# Patient Record
Sex: Female | Born: 1995 | Hispanic: Yes | Marital: Single | Smoking: Never smoker
Health system: Southern US, Community
[De-identification: ages and names within clinical notes are randomized; demographics above are authoritative.]

## PROBLEM LIST (undated history)

## (undated) DIAGNOSIS — Z789 Other specified health status: Secondary | ICD-10-CM

## (undated) DIAGNOSIS — K2970 Gastritis, unspecified, without bleeding: Secondary | ICD-10-CM

## (undated) HISTORY — PX: NO PAST SURGERIES: SHX2092

---

## 2014-01-13 ENCOUNTER — Inpatient Hospital Stay (HOSPITAL_COMMUNITY)
Admission: AD | Admit: 2014-01-13 | Discharge: 2014-01-13 | Disposition: A | Discharge status: Home / Self Care | Payer: Self-pay | Source: Ambulatory Visit | Attending: Obstetrics and Gynecology | Admitting: Obstetrics and Gynecology

## 2014-01-13 ENCOUNTER — Encounter (HOSPITAL_COMMUNITY): Payer: Self-pay | Admitting: *Deleted

## 2014-01-13 DIAGNOSIS — B3731 Acute candidiasis of vulva and vagina: Secondary | ICD-10-CM | POA: Insufficient documentation

## 2014-01-13 DIAGNOSIS — N912 Amenorrhea, unspecified: Secondary | ICD-10-CM | POA: Insufficient documentation

## 2014-01-13 DIAGNOSIS — B373 Candidiasis of vulva and vagina: Secondary | ICD-10-CM

## 2014-01-13 DIAGNOSIS — N911 Secondary amenorrhea: Secondary | ICD-10-CM

## 2014-01-13 DIAGNOSIS — N390 Urinary tract infection, site not specified: Secondary | ICD-10-CM | POA: Insufficient documentation

## 2014-01-13 HISTORY — DX: Other specified health status: Z78.9

## 2014-01-13 LAB — WET PREP, GENITAL
Clue Cells Wet Prep HPF POC: NONE SEEN
Trich, Wet Prep: NONE SEEN
Yeast Wet Prep HPF POC: NONE SEEN

## 2014-01-13 LAB — URINE MICROSCOPIC-ADD ON

## 2014-01-13 LAB — URINALYSIS, ROUTINE W REFLEX MICROSCOPIC
Bilirubin Urine: NEGATIVE
Glucose, UA: NEGATIVE mg/dL
Ketones, ur: NEGATIVE mg/dL
Leukocytes, UA: NEGATIVE
Nitrite: NEGATIVE
Protein, ur: NEGATIVE mg/dL
Specific Gravity, Urine: 1.025 (ref 1.005–1.030)
Urobilinogen, UA: 0.2 mg/dL (ref 0.0–1.0)
pH: 5.5 (ref 5.0–8.0)

## 2014-01-13 LAB — POCT PREGNANCY, URINE: PREG TEST UR: NEGATIVE

## 2014-01-13 MED ORDER — MICONAZOLE NITRATE 2 % VA CREA: 1.0000 | TOPICAL_CREAM | Freq: Every day | VAGINAL | Status: DC

## 2014-01-13 MED ORDER — IBUPROFEN 600 MG PO TABS: 600.0000 mg | ORAL_TABLET | Freq: Four times a day (QID) | ORAL | Status: DC | PRN

## 2014-01-13 MED ORDER — CIPROFLOXACIN HCL 500 MG PO TABS: 500.0000 mg | ORAL_TABLET | Freq: Two times a day (BID) | ORAL | Status: DC

## 2014-01-13 NOTE — MAU Provider Note (Signed)
Chief Complaint: Abdominal Pain  First Provider Initiated Contact with Patient 01/13/14 2053      SUBJECTIVE HPI: Kristen Woodard is a 18 y.o. 762P1001 female, unknown pregnancy status, who presents with being late for her period, low abd pain 4/10 and vaginal discharge and itching. Hasn't taken UPT. Patient's last menstrual period was 11/16/2013. Usually has monthly cycles. Denies being under great stress, weight changes, current or recent hormonal contraception or therapy.   Past Medical History  Diagnosis Date  . Medical history non-contributory    OB History  Gravida Para Term Preterm AB SAB TAB Ectopic Multiple Living  2 1 1       1     # Outcome Date GA Lbr Len/2nd Weight Sex Delivery Anes PTL Lv  2 CUR           1 TRM 2013    M SVD EPI N Y     Past Surgical History  Procedure Laterality Date  . No past surgeries     History   Social History  . Marital Status: Single    Spouse Name: N/A    Number of Children: N/A  . Years of Education: N/A   Occupational History  . Not on file.   Social History Main Topics  . Smoking status: Never Smoker   . Smokeless tobacco: Not on file  . Alcohol Use: No  . Drug Use: No  . Sexual Activity: Yes    Birth Control/ Protection: None   Other Topics Concern  . Not on file   Social History Narrative  . No narrative on file   No current facility-administered medications on file prior to encounter.   No current outpatient prescriptions on file prior to encounter.   No Known Allergies  ROS: Pertinent positive items in HPI. Also pos for urinary frequency. Denies fever, chills, N/V/D/C, VB, vaginal odor, bleeding w/ IC, dyspareunia, hematuria, flank pain, hirsutism, unexplained weight changes, galactorrhea.   OBJECTIVE Blood pressure 102/58, pulse 85, temperature 99.1 F (37.3 C), temperature source Oral, resp. rate 18, height 5\' 5"  (1.651 m), weight 54.522 kg (120 lb 3.2 oz), last menstrual period 11/16/2013, SpO2  100.00%. GENERAL: Well-developed, well-nourished female in no acute distress.  HEENT: Normocephalic HEART: normal rate RESP: normal effort ABDOMEN: Soft, mild SP and LLQ TTP. Pos BS x 4. No CVAT.  EXTREMITIES: Nontender, no edema NEURO: Alert and oriented SPECULUM EXAM: NEFG, moderate thick, white, odorless discharge, no blood noted, cervix clean BIMANUAL: cervix closed; uterus normal size, no adnexal tenderness or masses. No CMT.  LAB RESULTS Results for orders placed during the hospital encounter of 01/13/14 (from the past 24 hour(s))  POCT PREGNANCY, URINE     Status: None   Collection Time    01/13/14  6:31 PM      Result Value Ref Range   Preg Test, Ur NEGATIVE  NEGATIVE  URINALYSIS, ROUTINE W REFLEX MICROSCOPIC     Status: Abnormal   Collection Time    01/13/14  6:35 PM      Result Value Ref Range   Color, Urine YELLOW  YELLOW   APPearance CLEAR  CLEAR   Specific Gravity, Urine 1.025  1.005 - 1.030   pH 5.5  5.0 - 8.0   Glucose, UA NEGATIVE  NEGATIVE mg/dL   Hgb urine dipstick LARGE (*) NEGATIVE   Bilirubin Urine NEGATIVE  NEGATIVE   Ketones, ur NEGATIVE  NEGATIVE mg/dL   Protein, ur NEGATIVE  NEGATIVE mg/dL   Urobilinogen, UA 0.2  0.0 - 1.0 mg/dL   Nitrite NEGATIVE  NEGATIVE   Leukocytes, UA NEGATIVE  NEGATIVE  URINE MICROSCOPIC-ADD ON     Status: Abnormal   Collection Time    01/13/14  6:35 PM      Result Value Ref Range   Squamous Epithelial / LPF FEW (*) RARE   WBC, UA 0-2  <3 WBC/hpf   RBC / HPF 3-6  <3 RBC/hpf   Bacteria, UA FEW (*) RARE   Urine-Other MUCOUS PRESENT    WET PREP, GENITAL     Status: Abnormal   Collection Time    01/13/14  9:07 PM      Result Value Ref Range   Yeast Wet Prep HPF POC NONE SEEN  NONE SEEN   Trich, Wet Prep NONE SEEN  NONE SEEN   Clue Cells Wet Prep HPF POC NONE SEEN  NONE SEEN   WBC, Wet Prep HPF POC FEW (*) NONE SEEN    IMAGING No results found.  MAU COURSE  ASSESSMENT 1. UTI (lower urinary tract infection)   2.  Secondary amenorrhea   3. Candidiasis of vulva and vagina     PLAN Discharge home     Follow-up Information   Follow up with [facility]. (As needed if no improvement by Monday 01/18/2014)    Contact information:   [address] [place] [id number] [phone]       [facility] (As needed in emergencies)    Contact information:   [address] [id number] [place] [id number] [phone]       Medication List         ACIDOPHILUS PO  Take 1 capsule by mouth daily.     ADVANCED PROBIOTIC 10 PO  Take 1 capsule by mouth daily.     ciprofloxacin 500 MG tablet  Commonly known as:  CIPRO  Take 1 tablet (500 mg total) by mouth 2 (two) times daily.     ibuprofen 600 MG tablet  Commonly known as:  ADVIL,MOTRIN  Take 1 tablet (600 mg total) by mouth every 6 (six) hours as needed for moderate pain.     miconazole 2 % vaginal cream  Commonly known as:  MONISTAT 7  Place 1 Applicatorful vaginally at bedtime.         [place] Bonnetta Allbee, CNM 01/13/2014  10:26 PM

## 2014-01-13 NOTE — Discharge Instructions (Signed)
Vulvovaginitis Candidisica (Candidal Vulvovaginitis) La vulvovaginitis candidisica es una infeccin de la vagina y la vulva. La vulva es la piel que rodea la abertura de la vagina. Puede causar picazn y Far[place] Islandsmolestias dentro de y alrededor de la vagina.  CUIDADOS EN EL HOGAR  Slo tome medicamentos como lo indique su mdico.  No mantenga relaciones sexuales hasta que la infeccin haya curado o segn le indique el mdico.  Practique sexo seguro.  Informe a su compaero sexual acerca de su infeccin.  No tome duchas vaginales ni use tampones.  Use ropa interior de algodn. No utilice pantalones ni pantimedias ajustados.  Coma yogur. Esto puede ayudar a tratar y prevenir las infecciones por cndida. SOLICITE AYUDA DE INMEDIATO SI:   Tiene fiebre.  Los problemas empeoran durante el tratamiento, o si no mejora luego de [address] das.  Tiene malestar, irritacin, o picazn en la zona de la vagina o la vulva.  Siente dolor en al Gannett Comantener relaciones sexuales.  Comienza a sentir dolor abdominal. ASEGRESE DE QUE:  Comprende estas instrucciones.  Controlar su enfermedad.  Solicitar ayuda de inmediato si no mejora o empeora. Document Released: 08/18/2010 Document Revised: 07/21/2013 [facility] Patient Information 2015 [place]ExitCare, [name]. This information is not intended to replace advice given to you by your health care provider. Make sure you discuss any questions you have with your health care provider.   Infeccin urinaria  (Urinary Tract Infection)  La infeccin urinaria puede ocurrir en Corporate treasurercualquier lugar del tracto urinario. El tracto urinario es un sistema de drenaje del cuerpo por el que se eliminan los desechos y el exceso de [place]. El tracto urinario est formado por dos riones, dos urteres, la vejiga y Engineer, miningla uretra. Los riones son rganos que tienen forma de frijol. Cada rin tiene aproximadamente el tamao del puo. Estn situados debajo de las [place]costillas, uno a cada lado de la columna  vertebral CAUSAS  La causa de la infeccin son los microbios, que son organismos microscpicos, que incluyen hongos, virus, y bacterias. Estos organismos son tan pequeos que slo pueden verse a travs del microscopio. Las bacterias son los microorganismos que ms comnmente causan infecciones urinarias.  SNTOMAS  Los sntomas pueden variar segn la edad y el sexo del paciente y por la ubicacin de la infeccin. Los sntomas en las mujeres jvenes incluyen la necesidad frecuente e intensa de orinar y una sensacin dolorosa de ardor en la vejiga o en la uretra durante la miccin. Las mujeres y los hombres mayores podrn sentir cansancio, temblores y debilidad y Futures tradersentir dolores musculares y Engineer, miningdolor abdominal. Si tiene Dunlapfiebre, puede significar que la infeccin est en los riones. Otros sntomas son dolor en la espalda o en los lados debajo de las [place]illas, nuseas y vmitos.  DIAGNSTICO  Para diagnosticar una infeccin urinaria, el mdico le preguntar acerca de sus sntomas. Genuine Partsambin le solicitar una Rockdalemuestra de Comor[place]orina. La muestra de orina se analiza para Engineer, manufacturingdetectar bacterias y glbulos blancos de Risk managerla sangre. Los glbulos blancos se forman en el organismo para ayudar a Artistcombatir las infecciones.  TRATAMIENTO  Por lo general, las infecciones urinarias pueden tratarse con medicamentos. Debido a que la Harley-Davidsonmayora de las infecciones son causadas por bacterias, por lo general pueden tratarse con antibiticos. La eleccin del antibitico y la duracin del tratamiento depender de sus sntomas y el tipo de bacteria causante de la infeccin.  INSTRUCCIONES PARA EL CUIDADO EN EL HOGAR   Si le recetaron antibiticos, tmelos exactamente como su mdico le indique. Termine el medicamento aunque se  sienta mejor despus de haber tomado slo algunos.  Beba gran cantidad de lquido para mantener la orina de tono claro o color amarillo plido.  Evite la cafena, el t y las [address] Hospital Placebebidas gaseosas. Estas sustancias irritan la  vejiga.  Vaciar la vejiga con frecuencia. Evite retener la orina durante largos perodos.  Vace la vejiga antes y despus de Management consultanttener relaciones sexuales.  Despus de mover el intestino, las mujeres deben higienizarse la regin perineal desde adelante hacia atrs. Use slo un papel tissue por vez. SOLICITE ATENCIN MDICA SI:   Siente dolor en la espalda.  Le sube la fiebre.  Los sntomas no mejoran luego de [address] das. SOLICITE ATENCIN MDICA DE INMEDIATO SI:   Siente dolor intenso en la espalda o en la zona inferior del abdomen.  Comienza a sentir escalofros.  Tiene nuseas o vmitos.  Tiene una sensacin continua de quemazn o molestias al ConocoPhillipsorinar. ASEGRESE DE QUE:   Comprende estas instrucciones.  Controlar su enfermedad.  Solicitar ayuda de inmediato si no mejora o empeora. Document Released: 04/25/2005 Document Revised: 04/09/2012 [facility]Exit[facility] Patient Information 2015 [place]ExitCare, [name]. This information is not intended to replace advice given to you by your health care provider. Make sure you discuss any questions you have with your health care provider.

## 2014-01-13 NOTE — MAU Note (Signed)
Has been having abd pain for the last wk and a half.  Doesn't know if she is preg.

## 2014-01-15 LAB — URINE CULTURE: Special Requests: NORMAL

## 2014-01-16 LAB — GC/CHLAMYDIA PROBE AMP
CT Probe RNA: NEGATIVE
GC Probe RNA: NEGATIVE

## 2014-01-18 NOTE — MAU Provider Note (Signed)
Attestation of Attending Supervision of Advanced Practitioner: Evaluation and management procedures were performed by the PA/NP/CNM/OB Fellow under my supervision/collaboration. Chart reviewed and agree with management and plan.  FERGUSON,JOHN V 01/18/2014 7:59 PM

## 2014-01-27 ENCOUNTER — Inpatient Hospital Stay (HOSPITAL_COMMUNITY): Payer: Self-pay

## 2014-01-27 ENCOUNTER — Inpatient Hospital Stay (HOSPITAL_COMMUNITY)
Admission: AD | Admit: 2014-01-27 | Discharge: 2014-01-27 | Disposition: A | Discharge status: Home / Self Care | Payer: Self-pay | Source: Ambulatory Visit | Attending: Family Medicine | Admitting: Family Medicine

## 2014-01-27 ENCOUNTER — Encounter (HOSPITAL_COMMUNITY): Payer: Self-pay | Admitting: Medical

## 2014-01-27 DIAGNOSIS — O9989 Other specified diseases and conditions complicating pregnancy, childbirth and the puerperium: Secondary | ICD-10-CM

## 2014-01-27 DIAGNOSIS — R109 Unspecified abdominal pain: Secondary | ICD-10-CM | POA: Insufficient documentation

## 2014-01-27 DIAGNOSIS — N644 Mastodynia: Secondary | ICD-10-CM | POA: Insufficient documentation

## 2014-01-27 DIAGNOSIS — O99891 Other specified diseases and conditions complicating pregnancy: Secondary | ICD-10-CM | POA: Insufficient documentation

## 2014-01-27 DIAGNOSIS — O26899 Other specified pregnancy related conditions, unspecified trimester: Secondary | ICD-10-CM

## 2014-01-27 LAB — CBC WITH DIFFERENTIAL/PLATELET
BASOS PCT: 0 % (ref 0–1)
Basophils Absolute: 0 10*3/uL (ref 0.0–0.1)
Eosinophils Absolute: 0.5 10*3/uL (ref 0.0–0.7)
Eosinophils Relative: 6 % — ABNORMAL HIGH (ref 0–5)
HEMATOCRIT: 39.1 % (ref 36.0–46.0)
HEMOGLOBIN: 13.7 g/dL (ref 12.0–15.0)
Lymphocytes Relative: 35 % (ref 12–46)
Lymphs Abs: 2.9 10*3/uL (ref 0.7–4.0)
MCH: 32.8 pg (ref 26.0–34.0)
MCHC: 35 g/dL (ref 30.0–36.0)
MCV: 93.5 fL (ref 78.0–100.0)
MONO ABS: 0.8 10*3/uL (ref 0.1–1.0)
MONOS PCT: 9 % (ref 3–12)
Neutro Abs: 4.3 10*3/uL (ref 1.7–7.7)
Neutrophils Relative %: 50 % (ref 43–77)
Platelets: 232 10*3/uL (ref 150–400)
RBC: 4.18 MIL/uL (ref 3.87–5.11)
RDW: 12.6 % (ref 11.5–15.5)
WBC: 8.4 10*3/uL (ref 4.0–10.5)

## 2014-01-27 LAB — URINALYSIS, ROUTINE W REFLEX MICROSCOPIC
BILIRUBIN URINE: NEGATIVE
GLUCOSE, UA: NEGATIVE mg/dL
Ketones, ur: NEGATIVE mg/dL
Leukocytes, UA: NEGATIVE
Nitrite: NEGATIVE
PROTEIN: NEGATIVE mg/dL
Specific Gravity, Urine: 1.01 (ref 1.005–1.030)
Urobilinogen, UA: 0.2 mg/dL (ref 0.0–1.0)
pH: 6 (ref 5.0–8.0)

## 2014-01-27 LAB — POCT PREGNANCY, URINE: PREG TEST UR: POSITIVE — AB

## 2014-01-27 LAB — URINE MICROSCOPIC-ADD ON

## 2014-01-27 LAB — HCG, QUANTITATIVE, PREGNANCY: hCG, Beta Chain, Quant, S: 386 m[IU]/mL — ABNORMAL HIGH (ref <5)

## 2014-01-27 NOTE — MAU Note (Signed)
Painful intercourse x 3 days; pain in vaginal walls during intercourse. Denies vaginal irritation or itching, vaginal pain when not having intercourse. Lower abdominal cramping x 4 days. Denies vaginal bleeding. Vaginal discharge is white & thick. Burning with urination x 1 week. LMP 5/24. No birth control. Left breast pain & nipple discharge x 2 weeks; discharge is yellow & watery. Denies fevers.

## 2014-01-27 NOTE — MAU Provider Note (Signed)
History     CSN: [id number]  Arrival date and time: 01/27/14 2103   First Provider Initiated Contact with Patient 01/27/14 2218      Chief Complaint  Patient presents with  . Breast Pain  . Abdominal Pain   HPI [name] is a 18 y.o. G2P1001 at 5392w3d by LMP who presents to MAU today with complaint of lower abdominal cramping and vaginal discharge. The patient states LMP of 12/20/13. She is unsure if she is pregnant. She endorses a thick, white discharge currently with mild itching, but is currently taking Rx for Miconazole for yeast. She feels that this is helping. She denies vaginal bleeding, N//V or fever. She does endorse occasional dysuria but just recently completed a course of Cipro.   OB History   Grav Para Term Preterm Abortions TAB SAB Ect Mult Living   2 1 1       1       Past Medical History  Diagnosis Date  . Medical history non-contributory     Past Surgical History  Procedure Laterality Date  . No past surgeries      History reviewed. No pertinent family history.  History  Substance Use Topics  . Smoking status: Never Smoker   . Smokeless tobacco: Not on file  . Alcohol Use: No    Allergies: No Known Allergies  Prescriptions prior to admission  Medication Sig Dispense Refill  . ciprofloxacin (CIPRO) 500 MG tablet Take 1 tablet (500 mg total) by mouth 2 (two) times daily.  6 tablet  0  . ibuprofen (ADVIL,MOTRIN) 600 MG tablet Take 1 tablet (600 mg total) by mouth every 6 (six) hours as needed for moderate pain.  30 tablet  1  . miconazole (MONISTAT 7) 2 % vaginal cream Place 1 Applicatorful vaginally at bedtime.  45 g  0    Review of Systems  Constitutional: Negative for fever and malaise/fatigue.  Gastrointestinal: Positive for nausea, vomiting and abdominal pain. Negative for diarrhea and constipation.  Genitourinary: Positive for dysuria. Negative for urgency, frequency and hematuria.       Neg -vaginal bleeding + vaginal discharge    Physical Exam   Blood pressure 95/50, pulse 64, temperature 98.9 F (37.2 C), temperature source Oral, resp. rate 16, height 5\' 5"  (1.651 m), weight 123 lb (55.792 kg), last menstrual period 12/20/2013, not currently breastfeeding.  Physical Exam  Constitutional: She is oriented to person, place, and time. She appears well-developed and well-nourished. No distress.  HENT:  Head: Normocephalic and atraumatic.  Cardiovascular: Normal rate.   Respiratory: Effort normal.  GI: Soft. Bowel sounds are normal. She exhibits no distension and no mass. There is tenderness (mild tenderness to palpation of the lower abdomen at midline). There is no rebound and no guarding.  Neurological: She is alert and oriented to person, place, and time.  Skin: Skin is warm and dry. No erythema.  Psychiatric: She has a normal mood and affect.   Pelvic deferred - patient is still using Miconazole today  Results for orders placed during the hospital encounter of 01/27/14 (from the past 24 hour(s))  URINALYSIS, ROUTINE W REFLEX MICROSCOPIC     Status: Abnormal   Collection Time    01/27/14  9:15 PM      Result Value Ref Range   Color, Urine YELLOW  YELLOW   APPearance CLEAR  CLEAR   Specific Gravity, Urine 1.010  1.005 - 1.030   pH 6.0  5.0 - 8.0   Glucose, UA  NEGATIVE  NEGATIVE mg/dL   Hgb urine dipstick TRACE (*) NEGATIVE   Bilirubin Urine NEGATIVE  NEGATIVE   Ketones, ur NEGATIVE  NEGATIVE mg/dL   Protein, ur NEGATIVE  NEGATIVE mg/dL   Urobilinogen, UA 0.2  0.0 - 1.0 mg/dL   Nitrite NEGATIVE  NEGATIVE   Leukocytes, UA NEGATIVE  NEGATIVE  URINE MICROSCOPIC-ADD ON     Status: None   Collection Time    01/27/14  9:15 PM      Result Value Ref Range   Squamous Epithelial / LPF RARE  RARE   WBC, UA 0-2  <3 WBC/hpf   RBC / HPF 0-2  <3 RBC/hpf   Bacteria, UA RARE  RARE  POCT PREGNANCY, URINE     Status: Abnormal   Collection Time    01/27/14  9:22 PM      Result Value Ref Range   Preg Test, Ur  POSITIVE (*) NEGATIVE  CBC WITH DIFFERENTIAL     Status: Abnormal   Collection Time    01/27/14 10:28 PM      Result Value Ref Range   WBC 8.4  4.0 - 10.5 K/uL   RBC 4.18  3.87 - 5.11 MIL/uL   Hemoglobin 13.7  12.0 - 15.0 g/dL   HCT 81.139.1  91.436.0 - 78.246.0 %   MCV 93.5  78.0 - 100.0 fL   MCH 32.8  26.0 - 34.0 pg   MCHC 35.0  30.0 - 36.0 g/dL   RDW 95.612.6  21.311.5 - 08.615.5 %   Platelets 232  150 - 400 K/uL   Neutrophils Relative % 50  43 - 77 %   Neutro Abs 4.3  1.7 - 7.7 K/uL   Lymphocytes Relative 35  12 - 46 %   Lymphs Abs 2.9  0.7 - 4.0 K/uL   Monocytes Relative 9  3 - 12 %   Monocytes Absolute 0.8  0.1 - 1.0 K/uL   Eosinophils Relative 6 (*) 0 - 5 %   Eosinophils Absolute 0.5  0.0 - 0.7 K/uL   Basophils Relative 0  0 - 1 %   Basophils Absolute 0.0  0.0 - 0.1 K/uL  ABO/RH     Status: None   Collection Time    01/27/14 10:28 PM      Result Value Ref Range   ABO/RH(D) O POS    HCG, QUANTITATIVE, PREGNANCY     Status: Abnormal   Collection Time    01/27/14 10:28 PM      Result Value Ref Range   hCG, Beta Chain, Quant, S 386 (*) <5 mIU/mL   [place] Ob Comp Less 14 Wks  01/27/2014   CLINICAL DATA:  Pain. Clinical gestational age of [redacted] weeks and 3 days.  EXAM: OBSTETRIC <14 WK US AND TRANSVAGINAL OB US  TECHNIQUE: Both transabdominal and transvaginal ultrasound examinations were performed for complete evaluation of the gestation as well as the maternal uterus, adnexal regions, and pelvic cul-de-sac. Transvaginal technique was performed to assess early pregnancy.  COMPARISON:  None.  FINDINGS: Intrauterine gestational sac: None  Yolk sac:  No  Embryo:  No  Cardiac Activity: No  Heart Rate:  Not applicable bpm  Maternal uterus/adnexae:  Subchorionic hemorrhage: None  Right ovary: Normal  Left ovary: Normal  Other :  Free fluid:  Small amount of free fluid is noted within the pelvis.  IMPRESSION: 1. No intrauterine gestational sac, yolk sac, or fetal pole identified. Differential considerations include  intrauterine pregnancy too early to be  sonographically visualized, missed abortion, or ectopic pregnancy. Followup ultrasound is recommended in 10-14 days for further evaluation.   Electronically Signed   By: Signa Kell M.D.   On: 01/27/2014 23:12    MAU Course  Procedures None  MDM Interpreter present for visit CBC, quant hCG, ABO/Rh and [place] today   Assessment and Plan  A: Abdominal pain in pregnancy, antepartum Pregnancy of unknown location  P: Discharge home Patient advised to continue miconazole until completed for yeast vulvovaginitis previously diagnosed Patient given instructions for Unisom/B6 included on AVS Patient given contact information for [facility] to call to start prenatal care Patient to return to MAU in 48 hours for follow-up quant hCG Bleeding/ectopic precautions discussed Patient may return to MAU as needed or if her condition were to change or worsen   Freddi Starr, PA-C  01/27/2014, 10:18 PM

## 2014-01-27 NOTE — Discharge Instructions (Signed)
Kristen Woodard, Engineer, maintenancerimer trimestre (Pregnancy, First Trimester) El primer trimestre son los primeros tres meses en los que el beb crece dentro suyo. Es importante seguir las indicaciones del profesional que lo asiste. CUIDADOS EN EL HOGAR  No fumar.  No beba alcohol.  Tomar slo los medicamentos que el mdico le haya indicado.  Ejercicios.  Consuma una dieta saludable. Consuma alimentos balanceados.  Puede tener relaciones sexuales si no hay problemas con el Kristen Woodard.  Ayuda para los nuseas matinales:  Coma galletitas saladas antes de levantarse por la [place] Citymaana.  Coma 4  5 comidas pequeas por da en vez de tres grandes.  Beba lquidos entre comidas, no durante ellas.  Concurra a la Training and development officerconsulta con el profesional que lo asiste de acuerdo a lo que le haya indicado.  A veces necesita vitaminas extra y hierro Academic librariandurante el Kristen Woodard. Pregunte al mdico si las necesita. SOLICITE AYUDA DE INMEDIATO SI:  La temperatura oral.  Percibe que tiene una prdida con olor ftido que proviene de la vagina.  Observa una hemorragia que proviene de la vagina.  Siente dolor intenso en el vientre o la espalda.  Vomita sangre. Vmitos que parecen borra de caf.  Pierde ms de 1 Kg en una semana.  Gana ms de 2 Kg en una semana.  Aumenta ms de 1 Kg en una semana Y nota los tobillos, los pies o las piernas hinchados.  Siente mareos o se desmaya.  Ha estado cerca de personas con rubola. , varicela, o la quinta enfermedad.  Presenta dolor de cabeza, diarrea, dolor al orinar o le falta la respiracin. Document Released: 10/12/2008 Document Revised: 10/08/2011 [facility] Division[facility] Patient Information 2015 [place]ExitCare, [name]. This information is not intended to replace advice given to you by your health care provider. Make sure you discuss any questions you have with your health care provider.  For the nausea you can take an over the counter medication known as UNISOM in combination with VITAMIN B6 50mg  twice a day.  This is the same as a prescription medication known as Diclegis, and is the only known safe medication to take in early pregnancy. There are other medications available if this does now work. However, those medications are not known to be as safe.

## 2014-01-28 LAB — ABO/RH: ABO/RH(D): O POS

## 2014-01-28 NOTE — MAU Provider Note (Signed)
Attestation of Attending Supervision of Advanced Practitioner (PA/CNM/NP): Evaluation and management procedures were performed by the Advanced Practitioner under my supervision and collaboration.  I have reviewed the Advanced Practitioner's note and chart, and I agree with the management and plan.  Reva BoresPRATT,Mayci Haning S, MD [facility] Attending 01/28/2014 12:56 AM

## 2014-01-29 ENCOUNTER — Inpatient Hospital Stay (HOSPITAL_COMMUNITY)
Admission: AD | Admit: 2014-01-29 | Discharge: 2014-01-29 | Disposition: A | Discharge status: Home / Self Care | Payer: Self-pay | Source: Ambulatory Visit | Attending: Obstetrics and Gynecology | Admitting: Obstetrics and Gynecology

## 2014-01-29 DIAGNOSIS — O9989 Other specified diseases and conditions complicating pregnancy, childbirth and the puerperium: Principal | ICD-10-CM

## 2014-01-29 DIAGNOSIS — N898 Other specified noninflammatory disorders of vagina: Secondary | ICD-10-CM

## 2014-01-29 DIAGNOSIS — O3680X0 Pregnancy with inconclusive fetal viability, not applicable or unspecified: Secondary | ICD-10-CM

## 2014-01-29 DIAGNOSIS — O99891 Other specified diseases and conditions complicating pregnancy: Secondary | ICD-10-CM

## 2014-01-29 DIAGNOSIS — O26899 Other specified pregnancy related conditions, unspecified trimester: Secondary | ICD-10-CM

## 2014-01-29 DIAGNOSIS — R109 Unspecified abdominal pain: Secondary | ICD-10-CM

## 2014-01-29 LAB — URINALYSIS, ROUTINE W REFLEX MICROSCOPIC
BILIRUBIN URINE: NEGATIVE
Glucose, UA: NEGATIVE mg/dL
Ketones, ur: NEGATIVE mg/dL
Leukocytes, UA: NEGATIVE
NITRITE: NEGATIVE
PROTEIN: NEGATIVE mg/dL
Specific Gravity, Urine: 1.02 (ref 1.005–1.030)
UROBILINOGEN UA: 0.2 mg/dL (ref 0.0–1.0)
pH: 5.5 (ref 5.0–8.0)

## 2014-01-29 LAB — URINE MICROSCOPIC-ADD ON

## 2014-01-29 LAB — HCG, QUANTITATIVE, PREGNANCY: HCG, BETA CHAIN, QUANT, S: 1041 m[IU]/mL — AB (ref <5)

## 2014-01-29 NOTE — MAU Provider Note (Signed)
Chief Complaint: No chief complaint on file.  Contact at [address] Interpreter used   SUBJECTIVE HPI: Kristen Woodard is a 18 y.o. G2P1001 at 9910w5d by LMP who presents for F/U bHCG. Denies abdominal pain now; no bleeding. Presented 2 days ago here with lower abdominal pain and vaginal discharge. Quantitative beta hCG 386. [facility]inal[facility], WP and cultures negative. Hemoglobin 13.7; blood type O+.  Past Medical History  Diagnosis Date  . Medical history non-contributory    OB History  Gravida Para Term Preterm AB SAB TAB Ectopic Multiple Living  2 1 1       1     # Outcome Date GA Lbr Len/2nd Weight Sex Delivery Anes PTL Lv  2 CUR           1 TRM 2013    M SVD EPI N Y     Past Surgical History  Procedure Laterality Date  . No past surgeries     History   Social History  . Marital Status: Single    Spouse Name: N/A    Number of Children: N/A  . Years of Education: N/A   Occupational History  . Not on file.   Social History Main Topics  . Smoking status: Never Smoker   . Smokeless tobacco: Not on file  . Alcohol Use: No  . Drug Use: No  . Sexual Activity: Yes    Birth Control/ Protection: None   Other Topics Concern  . Not on file   Social History Narrative  . No narrative on file   No current facility-administered medications on file prior to encounter.   Current Outpatient Prescriptions on File Prior to Encounter  Medication Sig Dispense Refill  . miconazole (MONISTAT 7) 2 % vaginal cream Place 1 Applicatorful vaginally at bedtime.  45 g  0   No Known Allergies  ROS: Pertinent items in HPI  OBJECTIVE Last menstrual period 12/20/2013. GENERAL: Well-developed, well-nourished female in no acute distress.  HEENT: Normocephalic HEART: normal rate RESP: normal effort ABDOMEN: Soft, non-tender EXTREMITIES: Nontender, no edema NEURO: Alert and oriented  LAB RESULTS Results for orders placed during the hospital encounter  of 01/29/14 (from the past 24 hour(s))  URINALYSIS, ROUTINE W REFLEX MICROSCOPIC     Status: Abnormal   Collection Time    01/29/14 10:05 PM      Result Value Ref Range   Color, Urine YELLOW  YELLOW   APPearance CLEAR  CLEAR   Specific Gravity, Urine 1.020  1.005 - 1.030   pH 5.5  5.0 - 8.0   Glucose, UA NEGATIVE  NEGATIVE mg/dL   Hgb urine dipstick SMALL (*) NEGATIVE   Bilirubin Urine NEGATIVE  NEGATIVE   Ketones, ur NEGATIVE  NEGATIVE mg/dL   Protein, ur NEGATIVE  NEGATIVE mg/dL   Urobilinogen, UA 0.2  0.0 - 1.0 mg/dL   Nitrite NEGATIVE  NEGATIVE   Leukocytes, UA NEGATIVE  NEGATIVE  URINE MICROSCOPIC-ADD ON     Status: Abnormal   Collection Time    01/29/14 10:05 PM      Result Value Ref Range   Squamous Epithelial / LPF FEW (*) RARE   WBC, UA 0-2  <3 WBC/hpf   RBC / HPF 3-6  <3 RBC/hpf   Bacteria, UA FEW (*) RARE  HCG, QUANTITATIVE, PREGNANCY     Status: Abnormal   Collection Time    01/29/14 10:07 PM      Result Value Ref Range   hCG,  Beta Chain, Quant, S 1041 (*) <5 mIU/mL    IMAGING [place] Ob Comp Less 14 Wks  01/27/2014   CLINICAL DATA:  Pain. Clinical gestational age of [redacted] weeks and 3 days.  EXAM: OBSTETRIC <14 WK US AND TRANSVAGINAL OB US  TECHNIQUE: Both transabdominal and transvaginal ultrasound examinations were performed for complete evaluation of the gestation as well as the maternal uterus, adnexal regions, and pelvic cul-de-sac. Transvaginal technique was performed to assess early pregnancy.  COMPARISON:  None.  FINDINGS: Intrauterine gestational sac: None  Yolk sac:  No  Embryo:  No  Cardiac Activity: No  Heart Rate:  Not applicable bpm  Maternal uterus/adnexae:  Subchorionic hemorrhage: None  Right ovary: Normal  Left ovary: Normal  Other :  Free fluid:  Small amount of free fluid is noted within the pelvis.  IMPRESSION: 1. No intrauterine gestational sac, yolk sac, or fetal pole identified. Differential considerations include intrauterine pregnancy too early to be  sonographically visualized, missed abortion, or ectopic pregnancy. Followup ultrasound is recommended in 10-14 days for further evaluation.   Electronically Signed   By: Signa Kellaylor  Stroud M.D.   On: 01/27/2014 23:12    MAU COURSE  ASSESSMENT 1. Pregnancy, location unknown   2. Abdominal pain affecting pregnancy   G2P1001 4828w2d, appropriate rise in quant bHCG  PLAN Discharge home with ectopic precautions Viability US 02/08/14   Medication List         miconazole 2 % vaginal cream  Commonly known as:  MONISTAT 7  Place 1 Applicatorful vaginally at bedtime.       Follow-up Information   Follow up In 1 week. ([facility] [place] [facility] you with appt.)       Danae OrleansDeirdre C Chaz Mcglasson, CNM 01/29/2014  9:52 PM

## 2014-01-29 NOTE — Discharge Instructions (Signed)
Embarazo ectpico ( Ectopic Pregnancy) Un embarazo ectpico ocurre cuando el vulo fertilizado se fija (implanta) fuera del tero. La mayora de los embarazos ectpicos se producen en la trompa de WilliamsFalopio. Es raro que ocurran en el ovario, el intestino, la pelvis o el cuello uterino. En un embarazo ectpico, el vulo fertilizado no tiene la capacidad de llegar a ser un beb normal y sano.  Kristen DearUna ruptura en el embarazo ectpico se produce cuando la trompa de Falopio se desgarra o se rompe y Futures traderda como resultado una hemorragia interna. A menudo hay un intenso dolor abdominal y en algunos casos sangrado vaginal. Tener un embarazo ectpico puede ser Kristen Dearuna experiencia que pone en peligro la vida. Si no se trata, esta grave afeccin puede requerir una transfusin de [name], ciruga abdominal, o incluso causar la muerte. CAUSAS  En la Harley-Davidsonmayora de los casos se sospecha un dao en las trompas de Falopio como causa del Multimedia programmerembarazo ectpico.  FACTORES DE RIESGO Dependiendo de sus circunstancias, el nivel de riesgo de tener un embarazo ectpico variar. El [name] de riesgo puede dividirse en tres categoras. Alto Riesgo  Usted ha realizado tratamientos para la infertilidad.  Ha tenido un embarazo ectpico previo.  Fue sometida a [place] Herz[place]inauna ciruga de trompas.  Tuvo una ciruga previa para ligar las trompas de Falopio (ligadura de trompas).  Tiene problemas o enfermedades en las trompas.  Ha estado expuesta al DES. El DES es un medicamento que se Nechama Guardutiliz hasta 1971 y Cox Communicationstuvo efectos en los bebs cuyas madres lo tomaron.  Queda embarazada mientras Botswanausa un dispositivo intrauterino (DIU) como mtodo anticonceptivo. Riesgo moderado  Tiene antecedentes de infertilidad.  Ha sufrido alguna enfermedad de transmisin sexual (ETS).  Tiene antecedentes de enfermedad plvica inflamatoria (EPI).  Tiene cicatrices por endometriosis.  Tiene mltiples parejas sexuales.  Fuma. Bajo riesgo  Fue sometida a una ciruga  plvica.  [name] duchas vaginales.  Comenz a ser [name] activa antes de los 18 aos de [name]. SIGNOS Y SNTOMAS  El embarazo ectpico se debe sospechar en cualquier mujer a la que le ha faltado un perodo y tiene dolor abdominal o sangrado.  Puede experimentar sntomas normales de [name], tales como:  Nuseas.  Cansancio.  Inflamacin mamaria.  Otros sntomas son:  Futures traderDolor durante las relaciones sexuales.  Hemorragia vaginal o manchado irregular.  Clicos o dolor en uno de los lados o en la zona inferior del abdomen.  Latidos cardacos acelerados.  Desmayarse al defecar.  Los sntomas de un embarazo ectpico con ruptura y hemorragias internas son:  Dolor intenso y sbito en el abdomen y la pelvis.  Mareos o Newell Rubbermaiddesmayos.  Dolor en la zona del hombro. DIAGNSTICO  Los exmenes que se indicarn son:  Test de [name].  Una ecografa.  Prueba de nivel especfico de hormona del embarazo en el torrente sanguneo.  Extraccin de [name] muestra de tejido del tero (dilatacin y curetaje, D y C).  Ciruga para [name] un examen visual del interior del abdomen usando un tubo delgado, que emite luz con una pequea cmara en el extremo (laparoscopio). TRATAMIENTO  Se podr aplicar una inyeccin de un medicamento denominado metotrexato. Este medicamento hace que se absorba el tejido del [name]. Se administra en los siguientes casos:  Hay un diagnstico temprano.  La trompa de Falopio no se ha roto.  Se la considera una buena candidata para recibir [name]. Por lo general, despus del tratamiento con metotrexato se comprueban los niveles de hormonas del [name]. Esto se hace para asegurarse de que  el medicamento es [name]. Puede llevar entre 4 y 6 semanas para que el [name] sea absorbido (aunque la mayora de los embarazos se [name]). Puede ser necesario realizar un tratamiento quirrgico. Se puede utilizar un laparoscopio para retirar el tejido del  [name]. Si hay una hemorragia interna grave, se hace un corte (incisin) en la zona inferior del abdomen (laparotoma) y se extirpa el embarazo ectpico. De este modo de detendr el sangrado. Es posible que tambin se extirpe parte de la trompa de Falopio o la trompa entera (salpingectoma). Luego de la [name], le harn un anlisis de hormona del embarazo para asegurarse de que no quedan tejidos. Quizs reciba la vacuna de inmunoglobulina Rho(D) si usted es Rh negativa y el padre es Rh positivo, o si no conoce el tipo de Rh del padre. Esto evita problemas en prximos embarazos. SOLICITE ATENCIN MDICA DE INMEDIATO SI:  Tiene sntomas de embarazo ectpico. Esto es una emergencia mdica. ASEGRESE DE QUE:  Comprende estas instrucciones.  Controlar su afeccin.  Recibir ayuda de inmediato si no mejora o si empeora. Document Released: 07/16/2005 Document Revised: 07/21/2013 [facility] Patient Information 2015 [name], [name]. This information is not intended to replace advice given to you by your health care provider. Make sure you discuss any questions you have with your health care provider.

## 2014-01-29 NOTE — MAU Note (Signed)
Kristen Woodard. Poe CNM [facility] Spanish Interpreter, to discuss test results and d/c plan. D/C home from Triage.

## 2014-01-29 NOTE — MAU Note (Signed)
Here for repeat BHCG. Denies any bleeding. Some sharp intemittent pains in lower stomach today.

## 2014-01-31 NOTE — MAU Provider Note (Signed)
Attestation of Attending Supervision of Advanced Practitioner (CNM/NP): Evaluation and management procedures were performed by the Advanced Practitioner under my supervision and collaboration.  I have reviewed the Advanced Practitioner's note and chart, and I agree with the management and plan.  [name] 01/31/2014 8:26 PM   

## 2014-02-10 ENCOUNTER — Encounter (HOSPITAL_COMMUNITY): Payer: Self-pay | Admitting: *Deleted

## 2014-02-10 ENCOUNTER — Inpatient Hospital Stay (HOSPITAL_COMMUNITY): Payer: Self-pay

## 2014-02-10 ENCOUNTER — Inpatient Hospital Stay (HOSPITAL_COMMUNITY)
Admission: AD | Admit: 2014-02-10 | Discharge: 2014-02-10 | Disposition: A | Discharge status: Home / Self Care | Payer: Self-pay | Source: Ambulatory Visit | Attending: Obstetrics & Gynecology | Admitting: Obstetrics & Gynecology

## 2014-02-10 DIAGNOSIS — R102 Pelvic and perineal pain: Secondary | ICD-10-CM

## 2014-02-10 DIAGNOSIS — R109 Unspecified abdominal pain: Secondary | ICD-10-CM | POA: Insufficient documentation

## 2014-02-10 DIAGNOSIS — O99891 Other specified diseases and conditions complicating pregnancy: Secondary | ICD-10-CM | POA: Insufficient documentation

## 2014-02-10 DIAGNOSIS — R21 Rash and other nonspecific skin eruption: Secondary | ICD-10-CM | POA: Insufficient documentation

## 2014-02-10 DIAGNOSIS — O26891 Other specified pregnancy related conditions, first trimester: Secondary | ICD-10-CM

## 2014-02-10 DIAGNOSIS — N949 Unspecified condition associated with female genital organs and menstrual cycle: Secondary | ICD-10-CM | POA: Insufficient documentation

## 2014-02-10 DIAGNOSIS — O9989 Other specified diseases and conditions complicating pregnancy, childbirth and the puerperium: Secondary | ICD-10-CM

## 2014-02-10 HISTORY — DX: Gastritis, unspecified, without bleeding: K29.70

## 2014-02-10 LAB — URINE MICROSCOPIC-ADD ON

## 2014-02-10 LAB — URINALYSIS, ROUTINE W REFLEX MICROSCOPIC
Bilirubin Urine: NEGATIVE
GLUCOSE, UA: NEGATIVE mg/dL
Ketones, ur: NEGATIVE mg/dL
LEUKOCYTES UA: NEGATIVE
Nitrite: NEGATIVE
PH: 5.5 (ref 5.0–8.0)
Protein, ur: NEGATIVE mg/dL
Urobilinogen, UA: 0.2 mg/dL (ref 0.0–1.0)

## 2014-02-10 NOTE — MAU Provider Note (Signed)
  History     CSN: [id number]  Arrival date and time: 02/10/14 1546   None     Chief Complaint  Patient presents with  . Abdominal Pain  . Rash   Abdominal Pain  Rash    Kristen Woodard is a 18 y.o. G2P1001 at 4371w3d who presents today for an US. She was seen here and was to have a FU US on 7/13. However, she states that she has not been called for an appointment. She is still having some lower abdominal cramping. She denies any bleeding. She states that she also has a rash on her arms and abdomen that comes and goes.   Past Medical History  Diagnosis Date  . Medical history non-contributory   . Gastritis     Past Surgical History  Procedure Laterality Date  . No past surgeries      History reviewed. No pertinent family history.  History  Substance Use Topics  . Smoking status: Never Smoker   . Smokeless tobacco: Not on file  . Alcohol Use: No    Allergies: No Known Allergies  No prescriptions prior to admission    Review of Systems  Gastrointestinal: Positive for abdominal pain.  Skin: Positive for rash.   Physical Exam   Blood pressure 95/47, pulse 91, temperature 98.6 F (37 C), temperature source Oral, resp. rate 16, last menstrual period 12/20/2013.  Physical Exam  Nursing note and vitals reviewed. Constitutional: She is oriented to person, place, and time. She appears well-developed and well-nourished. No distress.  Cardiovascular: Normal rate.   Respiratory: Effort normal.  GI: Soft. There is no tenderness. There is no rebound.  Neurological: She is alert and oriented to person, place, and time.  Skin: Skin is warm and dry.  2 small bumps on her right arm. They appear to be bug bites.   Psychiatric: She has a normal mood and affect.    MAU Course  Procedures  [place] Ob Transvaginal  02/10/2014   CLINICAL DATA:  Pelvic pain.  Early pregnancy.  EXAM: TRANSVAGINAL OB ULTRASOUND  TECHNIQUE: Transvaginal ultrasound was performed for complete  evaluation of the gestation as well as the maternal uterus, adnexal regions, and pelvic cul-de-sac.  COMPARISON:  01/27/2014  FINDINGS: Intrauterine gestational sac: Single  Yolk sac:  Yes  Embryo:  Yes  Cardiac Activity: Yes  Heart Rate: 121 bpm  CRL:   4.2  mm   6 w 1 d                  US EDC: 10/05/2014  Maternal uterus/adnexae: Small subchorionic hemorrhage. Normal ovaries.  IMPRESSION: Single intrauterine pregnancy of approximately 6 weeks 1 day gestation. Small subchorionic hemorrhage.   Electronically Signed   By: Geanie CooleyJim  Maxwell M.D.   On: 02/10/2014 17:02     Assessment and Plan   1. Pelvic pain affecting pregnancy in first trimester, antepartum    First trimester precautions  Return to MAU as needed Patient is on waiting list for adopt a mom program  Follow-up Information   [facility] [place].   Contact information:   [address] [place] [id number] [phone]       Tawnya CrookHogan, Heather Donovan 02/10/2014, 5:31 PM

## 2014-02-10 NOTE — MAU Note (Signed)
Pt seen in MAU earlier this month, was to return for U/S - was never called with appointment.  Today C/O cramping since Monday, denies bleeding.  Pt has patchy red rash on arms, legs, & abdomen.

## 2014-02-10 NOTE — Discharge Instructions (Signed)
Embarazo, Primer trimestre (Pregnancy, First Trimester) El primer trimestre son los primeros tres meses en los que el beb crece dentro suyo. Es importante seguir las indicaciones del profesional que lo asiste. CUIDADOS EN EL HOGAR  No fumar.  No beba alcohol.  Tomar slo los medicamentos que el mdico le haya indicado.  Ejercicios.  Consuma una dieta saludable. Consuma alimentos balanceados.  Puede tener relaciones sexuales si no hay problemas con el embarazo.  Ayuda para los nuseas matinales:  Coma galletitas saladas antes de levantarse por la maana.  Coma 4  5 comidas pequeas por da en vez de tres grandes.  Beba lquidos entre comidas, no durante ellas.  Concurra a la consulta con el profesional que lo asiste de acuerdo a lo que le haya indicado.  A veces necesita vitaminas extra y hierro durante el embarazo. Pregunte al mdico si las necesita. SOLICITE AYUDA DE INMEDIATO SI:  La temperatura oral.  Percibe que tiene una prdida con olor ftido que proviene de la vagina.  Observa una hemorragia que proviene de la vagina.  Siente dolor intenso en el vientre o la espalda.  Vomita sangre. Vmitos que parecen borra de caf.  Pierde ms de 1 Kg en una semana.  Gana ms de 2 Kg en una semana.  Aumenta ms de 1 Kg en una semana Y nota los tobillos, los pies o las piernas hinchados.  Siente mareos o se desmaya.  Ha estado cerca de personas con rubola. , varicela, o la quinta enfermedad.  Presenta dolor de cabeza, diarrea, dolor al orinar o le falta la respiracin. Document Released: 10/12/2008 Document Revised: 10/08/2011 ExitCare Patient Information 2015 ExitCare, LLC. This information is not intended to replace advice given to you by your health care provider. Make sure you discuss any questions you have with your health care provider.  

## 2014-02-13 NOTE — MAU Provider Note (Signed)
Attestation of Attending Supervision of Advanced Practitioner (CNM/NP): Evaluation and management procedures were performed by the Advanced Practitioner under my supervision and collaboration.  I have reviewed the Advanced Practitioner's note and chart, and I agree with the management and plan.  HARRAWAY-SMITH, Pollie Poma 9:08 AM     

## 2014-02-19 ENCOUNTER — Inpatient Hospital Stay (HOSPITAL_COMMUNITY)
Admission: AD | Admit: 2014-02-19 | Discharge: 2014-02-19 | Disposition: A | Discharge status: Home / Self Care | Payer: Self-pay | Source: Ambulatory Visit | Attending: Obstetrics and Gynecology | Admitting: Obstetrics and Gynecology

## 2014-02-19 ENCOUNTER — Encounter (HOSPITAL_COMMUNITY): Payer: Self-pay | Admitting: *Deleted

## 2014-02-19 ENCOUNTER — Inpatient Hospital Stay (HOSPITAL_COMMUNITY): Payer: Self-pay

## 2014-02-19 DIAGNOSIS — N949 Unspecified condition associated with female genital organs and menstrual cycle: Secondary | ICD-10-CM | POA: Insufficient documentation

## 2014-02-19 DIAGNOSIS — O26899 Other specified pregnancy related conditions, unspecified trimester: Secondary | ICD-10-CM

## 2014-02-19 DIAGNOSIS — R109 Unspecified abdominal pain: Secondary | ICD-10-CM | POA: Insufficient documentation

## 2014-02-19 DIAGNOSIS — O208 Other hemorrhage in early pregnancy: Secondary | ICD-10-CM | POA: Insufficient documentation

## 2014-02-19 DIAGNOSIS — O209 Hemorrhage in early pregnancy, unspecified: Secondary | ICD-10-CM

## 2014-02-19 LAB — URINALYSIS, ROUTINE W REFLEX MICROSCOPIC
Bilirubin Urine: NEGATIVE
GLUCOSE, UA: NEGATIVE mg/dL
Hgb urine dipstick: NEGATIVE
KETONES UR: NEGATIVE mg/dL
Leukocytes, UA: NEGATIVE
Nitrite: NEGATIVE
Protein, ur: NEGATIVE mg/dL
Specific Gravity, Urine: 1.02 (ref 1.005–1.030)
Urobilinogen, UA: 0.2 mg/dL (ref 0.0–1.0)
pH: 6.5 (ref 5.0–8.0)

## 2014-02-19 MED ORDER — PROMETHAZINE HCL 25 MG PO TABS
25.0000 mg | ORAL_TABLET | Freq: Once | ORAL | Status: AC
  Administered 2014-02-19: 25 mg via ORAL
  Filled 2014-02-19: qty 1

## 2014-02-19 MED ORDER — OXYCODONE-ACETAMINOPHEN 5-325 MG PO TABS
1.0000 | ORAL_TABLET | Freq: Once | ORAL | Status: AC
  Administered 2014-02-19: 1 via ORAL
  Filled 2014-02-19: qty 1

## 2014-02-19 NOTE — Discharge Instructions (Signed)
Dolor abdominal en el embarazo °(Abdominal Pain During Pregnancy) °El dolor abdominal es frecuente durante el embarazo. Generalmente no causa ningún daño. El dolor abdominal puede tener numerosas causas. Algunas causas son más graves que otras. Ciertas causas de dolor abdominal durante el embarazo se diagnostican fácilmente. A veces, se tarda un tiempo para llegar al diagnóstico. Otras veces la causa no se conoce. El dolor abdominal puede estar relacionado con alguna alteración del embarazo, o puede deberse a una causa totalmente diferente. Por este motivo, siempre consulte a su médico cuando sienta molestias abdominales. °INSTRUCCIONES PARA EL CUIDADO EN EL HOGAR  °Esté atenta al dolor para ver si hay cambios. Las siguientes indicaciones ayudarán a aliviar cualquier molestia que pueda sentir: °· No tenga relaciones sexuales y no coloque nada dentro de la vagina hasta que los síntomas hayan desaparecido completamente. °· Descanse todo lo que pueda hasta que el dolor se le haya calmado. °· Si siente náuseas, beba líquidos claros. Evite los alimentos sólidos mientras sienta malestar o tenga náuseas. °· Tome sólo medicamentos de venta libre o recetados, según las indicaciones del médico. °· Cumpla con todas las visitas de control, según le indique su médico. °SOLICITE ATENCIÓN MÉDICA DE INMEDIATO SI: °· Tiene un sangrado, pérdida de líquidos o elimina tejidos por la vagina. °· El dolor o los cólicos aumentan. °· Tiene vómitos persistentes. °· Comienza a sentir dolor al orinar u observa sangre. °· Tiene fiebre. °· Nota que los movimientos del bebé disminuyen. °· Siente intensa debilidad o se marea. °· Tiene dificultad para respirar con o sin dolor abdominal. °· Siente un dolor de cabeza intenso junto al dolor abdominal. °· Tiene una secreción vaginal anormal con dolor abdominal. °· Tiene diarrea persistente. °· El dolor abdominal sigue o empeora aún después de hacer reposo. °ASEGÚRESE DE QUE:  °· Comprende estas  instrucciones. °· Controlará su afección. °· Recibirá ayuda de inmediato si no mejora o si empeora. °Document Released: 07/16/2005 Document Revised: 05/06/2013 °ExitCare® Patient Information ©2015 ExitCare, LLC. This information is not intended to replace advice given to you by your health care provider. Make sure you discuss any questions you have with your health care provider. ° °

## 2014-02-19 NOTE — Progress Notes (Signed)
Only sees bleeding when wipes

## 2014-02-19 NOTE — Progress Notes (Signed)
Marie Williams CNM in earlier to discuss test results and d/c plan. WRitten and verbal d/c instructions given and understanding voiced. 

## 2014-02-19 NOTE — MAU Provider Note (Signed)
History     CSN: [id number]  Arrival date and time: 02/19/14 1950   First Provider Initiated Contact with Patient 02/19/14 2037      Chief Complaint  Patient presents with  . Vaginal Bleeding  . Abdominal Cramping   HPI Comments: Kristen Woodard 18 y.o. G2P1001 7326w5d presents to MAU with pelvic pain and spotting that have been ongoing since last night. She calls it an 8/10 and she would like something for pain. She denies any intercourse. She was seen 7/15 for same and U/S shows IUP.   Vaginal Bleeding Associated symptoms include abdominal pain and nausea. Pertinent negatives include no vomiting.  Abdominal Cramping Associated symptoms include nausea. Pertinent negatives include no vomiting.      Past Medical History  Diagnosis Date  . Medical history non-contributory   . Gastritis     Past Surgical History  Procedure Laterality Date  . No past surgeries      History reviewed. No pertinent family history.  History  Substance Use Topics  . Smoking status: Never Smoker   . Smokeless tobacco: Not on file  . Alcohol Use: No    Allergies: No Known Allergies  Prescriptions prior to admission  Medication Sig Dispense Refill  . Prenatal Vit-Fe Fumarate-FA (PRENATAL MULTIVITAMIN) TABS tablet Take 1 tablet by mouth daily at 12 noon.        Review of Systems  Constitutional: Negative.   HENT: Negative.   Eyes: Negative.   Respiratory: Negative.   Cardiovascular: Negative.   Gastrointestinal: Positive for nausea and abdominal pain. Negative for vomiting.  Genitourinary: Positive for vaginal bleeding.       Spotting  Musculoskeletal: Negative.   Skin: Negative.   Neurological: Negative.   Psychiatric/Behavioral: Negative.    Physical Exam   Blood pressure 108/55, pulse 73, temperature 98.3 F (36.8 C), resp. rate 18, height 5\' 3"  (1.6 m), weight 52.98 kg (116 lb 12.8 oz), last menstrual period 12/20/2013.  Physical Exam  Constitutional: She is oriented  to person, place, and time. She appears well-developed and well-nourished. No distress.  HENT:  Head: Normocephalic and atraumatic.  Eyes: Pupils are equal, round, and reactive to light.  GI: Soft. Bowel sounds are normal. She exhibits no distension and no mass. There is tenderness. There is no rebound and no guarding.  Genitourinary:  Genital:external negative Vaginal:small amount white discharge/ no blood Cervix:closed/ thick Bimanual:uterus tender   Neurological: She is alert and oriented to person, place, and time.  Skin: Skin is warm and dry.  Psychiatric: She has a normal mood and affect. Her behavior is normal. Judgment and thought content normal.    MAU Course  Procedures  MDM  OB Ultrasound limited  Percocet/ phenergan Care turned over to Kristen BourgeoisMarie Presli Woodard, CNMW at 2100 Assessment and Plan    [name], Rubbie BattiestLinda Woodard 02/19/2014, 8:46 PM   [place] Ob Transvaginal  02/19/2014   CLINICAL DATA:  Vaginal spotting with positive pregnancy test.  EXAM: TRANSVAGINAL OB ULTRASOUND  TECHNIQUE: Transvaginal ultrasound was performed for complete evaluation of the gestation as well as the maternal uterus, adnexal regions, and pelvic cul-de-sac.  COMPARISON:  02/10/2014.    FINDINGS: Intrauterine gestational sac: Visualized/normal in shape.                       Yolk sac:  Visualize                       Embryo:  Visualize  Cardiac Activity:  Visualize                      Heart Rate: 156 bpm                       CRL:   14.6  mm   7 w 6 d                    [place] EDC: 10/02/2014  Maternal uterus/adnexae: Small subchorionic hemorrhage noted. Maternal right ovary is normal in appearance. The maternal left ovary could not be visualized. No evidence for free fluid in cul-de-sac.    IMPRESSION: Single living intrauterine gestation at estimated 7 week 6 day gestational age by crown-rump length.  Small subchorionic hemorrhage.     Electronically Signed   By: Kennith Center M.D.   On: 02/19/2014 22:09    Reviewed the findings with patient. Encouraged her to seek prenatal care Has already talked with Health Dept.

## 2014-02-19 NOTE — MAU Note (Signed)
Pain in stomach and spotting today.

## 2014-02-20 NOTE — MAU Provider Note (Signed)
Attestation of Attending Supervision of Advanced Practitioner: Evaluation and management procedures were performed by the PA/NP/CNM/OB Fellow under my supervision/collaboration. Chart reviewed and agree with management and plan.  Tamsin Nader V 02/20/2014 1:30 AM

## 2014-03-14 LAB — OB RESULTS CONSOLE RUBELLA ANTIBODY, IGM: Rubella: IMMUNE

## 2014-03-14 LAB — OB RESULTS CONSOLE HIV ANTIBODY (ROUTINE TESTING): HIV: NONREACTIVE

## 2014-03-14 LAB — OB RESULTS CONSOLE ABO/RH: RH Type: POSITIVE

## 2014-03-14 LAB — OB RESULTS CONSOLE RPR: RPR: NONREACTIVE

## 2014-03-14 LAB — OB RESULTS CONSOLE ANTIBODY SCREEN: ANTIBODY SCREEN: NEGATIVE

## 2014-03-14 LAB — OB RESULTS CONSOLE GC/CHLAMYDIA
Chlamydia: NEGATIVE
GC PROBE AMP, GENITAL: NEGATIVE

## 2014-03-14 LAB — OB RESULTS CONSOLE HEPATITIS B SURFACE ANTIGEN: HEP B S AG: NEGATIVE

## 2014-04-23 ENCOUNTER — Other Ambulatory Visit (HOSPITAL_COMMUNITY): Payer: Self-pay | Admitting: Obstetrics

## 2014-04-23 DIAGNOSIS — Z3689 Encounter for other specified antenatal screening: Secondary | ICD-10-CM

## 2014-05-07 ENCOUNTER — Ambulatory Visit (HOSPITAL_COMMUNITY)
Admission: RE | Admit: 2014-05-07 | Discharge: 2014-05-07 | Disposition: A | Discharge status: Home / Self Care | Payer: Self-pay | Source: Ambulatory Visit | Attending: Obstetrics | Admitting: Obstetrics

## 2014-05-07 ENCOUNTER — Ambulatory Visit (HOSPITAL_COMMUNITY): Payer: Self-pay

## 2014-05-07 DIAGNOSIS — Z3A18 18 weeks gestation of pregnancy: Secondary | ICD-10-CM | POA: Insufficient documentation

## 2014-05-07 DIAGNOSIS — Z36 Encounter for antenatal screening of mother: Secondary | ICD-10-CM | POA: Insufficient documentation

## 2014-05-07 DIAGNOSIS — Z3689 Encounter for other specified antenatal screening: Secondary | ICD-10-CM

## 2014-05-20 ENCOUNTER — Other Ambulatory Visit (HOSPITAL_COMMUNITY): Payer: Self-pay | Admitting: Obstetrics

## 2014-05-20 DIAGNOSIS — Z3492 Encounter for supervision of normal pregnancy, unspecified, second trimester: Secondary | ICD-10-CM

## 2014-05-20 DIAGNOSIS — Z3689 Encounter for other specified antenatal screening: Secondary | ICD-10-CM

## 2014-05-31 ENCOUNTER — Encounter (HOSPITAL_COMMUNITY): Payer: Self-pay | Admitting: *Deleted

## 2014-06-04 ENCOUNTER — Ambulatory Visit (HOSPITAL_COMMUNITY)
Admission: RE | Admit: 2014-06-04 | Discharge: 2014-06-04 | Disposition: A | Discharge status: Home / Self Care | Payer: Self-pay | Source: Ambulatory Visit | Attending: Obstetrics | Admitting: Obstetrics

## 2014-06-04 ENCOUNTER — Ambulatory Visit (HOSPITAL_COMMUNITY): Payer: Self-pay

## 2014-06-04 DIAGNOSIS — Z3A22 22 weeks gestation of pregnancy: Secondary | ICD-10-CM | POA: Insufficient documentation

## 2014-06-04 DIAGNOSIS — IMO0002 Reserved for concepts with insufficient information to code with codable children: Secondary | ICD-10-CM | POA: Insufficient documentation

## 2014-06-04 DIAGNOSIS — Z0489 Encounter for examination and observation for other specified reasons: Secondary | ICD-10-CM | POA: Insufficient documentation

## 2014-06-04 DIAGNOSIS — Z3689 Encounter for other specified antenatal screening: Secondary | ICD-10-CM

## 2014-06-04 DIAGNOSIS — Z3492 Encounter for supervision of normal pregnancy, unspecified, second trimester: Secondary | ICD-10-CM

## 2014-06-04 DIAGNOSIS — Z36 Encounter for antenatal screening of mother: Secondary | ICD-10-CM | POA: Insufficient documentation

## 2014-07-30 NOTE — L&D Delivery Note (Signed)
Delivery Note At 7:01 AM a viable female was delivered via Vaginal, Spontaneous Delivery (Presentation: ; Occiput Anterior).  APGAR: , ; weight  .   Placenta status: Intact, Spontaneous.  Cord: 3 vessels with the following complications: None.  Cord pH: not done  Anesthesia: None  Episiotomy: None Lacerations: None Suture Repair: 2.0 Est. Blood Loss (mL): 250  Mom to postpartum.  Baby to Couplet care / Skin to Skin.  Blayre Papania A 09/30/2014, 7:08 AM

## 2014-09-07 LAB — OB RESULTS CONSOLE GBS: STREP GROUP B AG: NEGATIVE

## 2014-09-29 ENCOUNTER — Inpatient Hospital Stay (HOSPITAL_COMMUNITY)
Admission: AD | Admit: 2014-09-29 | Discharge: 2014-09-29 | Disposition: A | Discharge status: Home / Self Care | Payer: Self-pay | Source: Ambulatory Visit | Attending: Obstetrics | Admitting: Obstetrics

## 2014-09-29 ENCOUNTER — Encounter (HOSPITAL_COMMUNITY): Payer: Self-pay | Admitting: *Deleted

## 2014-09-29 DIAGNOSIS — N949 Unspecified condition associated with female genital organs and menstrual cycle: Secondary | ICD-10-CM | POA: Insufficient documentation

## 2014-09-29 DIAGNOSIS — Z3A39 39 weeks gestation of pregnancy: Secondary | ICD-10-CM | POA: Insufficient documentation

## 2014-09-29 DIAGNOSIS — R1030 Lower abdominal pain, unspecified: Secondary | ICD-10-CM | POA: Insufficient documentation

## 2014-09-29 DIAGNOSIS — Z3689 Encounter for other specified antenatal screening: Secondary | ICD-10-CM

## 2014-09-29 DIAGNOSIS — O368130 Decreased fetal movements, third trimester, not applicable or unspecified: Secondary | ICD-10-CM | POA: Insufficient documentation

## 2014-09-29 DIAGNOSIS — O368131 Decreased fetal movements, third trimester, fetus 1: Secondary | ICD-10-CM

## 2014-09-29 DIAGNOSIS — Z3A37 37 weeks gestation of pregnancy: Secondary | ICD-10-CM

## 2014-09-29 LAB — URINALYSIS, ROUTINE W REFLEX MICROSCOPIC
Bilirubin Urine: NEGATIVE
Glucose, UA: NEGATIVE mg/dL
KETONES UR: NEGATIVE mg/dL
Leukocytes, UA: NEGATIVE
NITRITE: NEGATIVE
Protein, ur: NEGATIVE mg/dL
Urobilinogen, UA: 0.2 mg/dL (ref 0.0–1.0)
pH: 5.5 (ref 5.0–8.0)

## 2014-09-29 LAB — URINE MICROSCOPIC-ADD ON

## 2014-09-29 NOTE — Progress Notes (Signed)
I assisted Ginger,RN with questions. Eda H Royal Interpreter.

## 2014-09-29 NOTE — MAU Note (Signed)
Pt presents to MAU with complaints of a decrease in fetal movement since last night. Pt also reports dizziness, pain in her lower abdomen and vaginal area with diarrhea since this morning.

## 2014-09-29 NOTE — MAU Provider Note (Signed)
History     CSN: [id number]  Arrival date and time: 09/29/14 [id number]   First Provider Initiated Contact with Patient 09/29/14 1049      Chief Complaint  Patient presents with  . Decreased Fetal Movement   HPI   [name] is a 19 y.o. female G2P1001 at 1018w4d who presents with decreased fetal movement. She felt that babies movements were different last night.   She also complains of upper and lower abdominal pain for about 1 week. The pain worsens when she is moving around or walking.  She is scheduled to see Dr. Gaynell FaceMarshall next week and will plan to induce labor if she is still pregnant.   OB History    Gravida Para Term Preterm AB TAB SAB Ectopic Multiple Living   2 1 1       1       Past Medical History  Diagnosis Date  . Medical history non-contributory   . Gastritis     Past Surgical History  Procedure Laterality Date  . No past surgeries      History reviewed. No pertinent family history.  History  Substance Use Topics  . Smoking status: Never Smoker   . Smokeless tobacco: Not on file  . Alcohol Use: No    Allergies: No Known Allergies  Prescriptions prior to admission  Medication Sig Dispense Refill Last Dose  . Prenatal Vit-Fe Fumarate-FA (PRENATAL MULTIVITAMIN) TABS tablet Take 1 tablet by mouth daily at 12 noon.   09/27/2014   Results for orders placed or performed during the hospital encounter of 09/29/14 (from the past 48 hour(s))  Urinalysis, Routine w reflex microscopic     Status: Abnormal   Collection Time: 09/29/14 10:33 AM  Result Value Ref Range   Color, Urine YELLOW YELLOW   APPearance CLEAR CLEAR   Specific Gravity, Urine >1.030 (H) 1.005 - 1.030   pH 5.5 5.0 - 8.0   Glucose, UA NEGATIVE NEGATIVE mg/dL   Hgb urine dipstick SMALL (A) NEGATIVE   Bilirubin Urine NEGATIVE NEGATIVE   Ketones, ur NEGATIVE NEGATIVE mg/dL   Protein, ur NEGATIVE NEGATIVE mg/dL   Urobilinogen, UA 0.2 0.0 - 1.0 mg/dL   Nitrite NEGATIVE NEGATIVE   Leukocytes, UA NEGATIVE NEGATIVE  Urine microscopic-add on     Status: None   Collection Time: 09/29/14 10:33 AM  Result Value Ref Range   Squamous Epithelial / LPF RARE RARE   WBC, UA 0-2 <3 WBC/hpf    Review of Systems  Constitutional: Negative for fever and chills.  Gastrointestinal: Positive for nausea (Occasional ), vomiting, abdominal pain (Upper and lower abdominal pain ) and diarrhea (10 episodes of diarrhea in 24 hours. ).  Neurological: Negative for dizziness.   Physical Exam   Blood pressure 110/66, pulse 74, temperature 98.1 F (36.7 C), temperature source Oral, resp. rate 18, last menstrual period 12/20/2013.  Physical Exam  Constitutional: She is oriented to person, place, and time. She appears well-developed and well-nourished. No distress.  HENT:  Head: Normocephalic.  Neck: Neck supple.  Respiratory: Effort normal.  GI: Soft. There is generalized tenderness. There is no rigidity and no guarding.  Genitourinary:  Dilation: 1.5 Effacement (%): 70 Cervical Position: Posterior (maternal left) Station: -2 Presentation: Vertex Exam by:: Exie ParodyA. Gagliardo, RN  Musculoskeletal: Normal range of motion.  Neurological: She is alert and oriented to person, place, and time.  Skin: Skin is warm. She is not diaphoretic.  Psychiatric: Her behavior is normal.    Fetal Tracing: Baseline: 125  bpm  Variability: Moderate  Accelerations: 15x15 Decelerations: None Toco: Irregular pattern with UI   MAU Course  Procedures  None  MDM UA  Assessment and Plan   A:  Decreased fetal movements Reactive fetal tracing.  Round ligament pain    P:  Discharge home in stable condition Follow up with Dr. Gaynell Face as scheduled  Kick counts Return if symptoms worsen   Iona Hansen Paulino Cork, NP 09/29/2014 3:59 PM

## 2014-09-30 ENCOUNTER — Inpatient Hospital Stay (HOSPITAL_COMMUNITY)
Admission: AD | Admit: 2014-09-30 | Discharge: 2014-10-01 | DRG: 775 | Disposition: A | Discharge status: Home / Self Care | Payer: Medicaid Other | Source: Ambulatory Visit | Attending: Obstetrics | Admitting: Obstetrics

## 2014-09-30 ENCOUNTER — Encounter (HOSPITAL_COMMUNITY): Payer: Self-pay

## 2014-09-30 DIAGNOSIS — Z3A39 39 weeks gestation of pregnancy: Secondary | ICD-10-CM | POA: Diagnosis present

## 2014-09-30 DIAGNOSIS — Z3490 Encounter for supervision of normal pregnancy, unspecified, unspecified trimester: Secondary | ICD-10-CM

## 2014-09-30 DIAGNOSIS — Z3483 Encounter for supervision of other normal pregnancy, third trimester: Secondary | ICD-10-CM | POA: Diagnosis present

## 2014-09-30 LAB — CBC
HEMATOCRIT: 37.9 % (ref 36.0–46.0)
Hemoglobin: 13.5 g/dL (ref 12.0–15.0)
MCH: 32.6 pg (ref 26.0–34.0)
MCHC: 35.6 g/dL (ref 30.0–36.0)
MCV: 91.5 fL (ref 78.0–100.0)
Platelets: 212 10*3/uL (ref 150–400)
RBC: 4.14 MIL/uL (ref 3.87–5.11)
RDW: 13.3 % (ref 11.5–15.5)
WBC: 12.9 10*3/uL — ABNORMAL HIGH (ref 4.0–10.5)

## 2014-09-30 LAB — TYPE AND SCREEN
ABO/RH(D): O POS
Antibody Screen: NEGATIVE

## 2014-09-30 LAB — RPR: RPR: NONREACTIVE

## 2014-09-30 MED ORDER — TETANUS-DIPHTH-ACELL PERTUSSIS 5-2.5-18.5 LF-MCG/0.5 IM SUSP
0.5000 mL | Freq: Once | INTRAMUSCULAR | Status: DC
Start: 2014-10-01 — End: 2014-10-01

## 2014-09-30 MED ORDER — ONDANSETRON HCL 4 MG/2ML IJ SOLN: 4.0000 mg | Freq: Four times a day (QID) | INTRAMUSCULAR | Status: DC | PRN

## 2014-09-30 MED ORDER — ONDANSETRON HCL 4 MG/2ML IJ SOLN: 4.0000 mg | INTRAMUSCULAR | Status: DC | PRN

## 2014-09-30 MED ORDER — OXYCODONE-ACETAMINOPHEN 5-325 MG PO TABS
2.0000 | ORAL_TABLET | ORAL | Status: DC | PRN
Start: 2014-09-30 — End: 2014-10-01

## 2014-09-30 MED ORDER — CITRIC ACID-SODIUM CITRATE 334-500 MG/5ML PO SOLN: 30.0000 mL | ORAL | Status: DC | PRN

## 2014-09-30 MED ORDER — LACTATED RINGERS IV SOLN: 500.0000 mL | INTRAVENOUS | Status: DC | PRN

## 2014-09-30 MED ORDER — DIPHENHYDRAMINE HCL 50 MG/ML IJ SOLN: 12.5000 mg | INTRAMUSCULAR | Status: DC | PRN

## 2014-09-30 MED ORDER — IBUPROFEN 600 MG PO TABS
600.0000 mg | ORAL_TABLET | Freq: Four times a day (QID) | ORAL | Status: DC
  Administered 2014-09-30 – 2014-10-01 (×5): 600 mg via ORAL
  Filled 2014-09-30 (×5): qty 1

## 2014-09-30 MED ORDER — LIDOCAINE HCL (PF) 1 % IJ SOLN
30.0000 mL | INTRAMUSCULAR | Status: DC | PRN
  Filled 2014-09-30: qty 30

## 2014-09-30 MED ORDER — DIBUCAINE 1 % RE OINT: 1.0000 "application " | TOPICAL_OINTMENT | RECTAL | Status: DC | PRN

## 2014-09-30 MED ORDER — FENTANYL 2.5 MCG/ML BUPIVACAINE 1/10 % EPIDURAL INFUSION (WH - ANES)
14.0000 mL/h | INTRAMUSCULAR | Status: DC | PRN
  Filled 2014-09-30: qty 125

## 2014-09-30 MED ORDER — LACTATED RINGERS IV SOLN: 500.0000 mL | Freq: Once | INTRAVENOUS | Status: DC

## 2014-09-30 MED ORDER — ACETAMINOPHEN 325 MG PO TABS: 650.0000 mg | ORAL_TABLET | ORAL | Status: DC | PRN

## 2014-09-30 MED ORDER — BENZOCAINE-MENTHOL 20-0.5 % EX AERO
1.0000 "application " | INHALATION_SPRAY | CUTANEOUS | Status: DC | PRN
  Administered 2014-09-30: 1 via TOPICAL
  Filled 2014-09-30: qty 56

## 2014-09-30 MED ORDER — DIPHENHYDRAMINE HCL 25 MG PO CAPS: 25.0000 mg | ORAL_CAPSULE | Freq: Four times a day (QID) | ORAL | Status: DC | PRN

## 2014-09-30 MED ORDER — LACTATED RINGERS IV SOLN
INTRAVENOUS | Status: DC
  Administered 2014-09-30: 06:00:00 via INTRAVENOUS

## 2014-09-30 MED ORDER — EPHEDRINE 5 MG/ML INJ
10.0000 mg | INTRAVENOUS | Status: DC | PRN
  Filled 2014-09-30: qty 2

## 2014-09-30 MED ORDER — FLEET ENEMA 7-19 GM/118ML RE ENEM: 1.0000 | ENEMA | RECTAL | Status: DC | PRN

## 2014-09-30 MED ORDER — ONDANSETRON HCL 4 MG PO TABS: 4.0000 mg | ORAL_TABLET | ORAL | Status: DC | PRN

## 2014-09-30 MED ORDER — PHENYLEPHRINE 40 MCG/ML (10ML) SYRINGE FOR IV PUSH (FOR BLOOD PRESSURE SUPPORT)
80.0000 ug | PREFILLED_SYRINGE | INTRAVENOUS | Status: DC | PRN
  Filled 2014-09-30: qty 2
  Filled 2014-09-30: qty 20

## 2014-09-30 MED ORDER — OXYCODONE-ACETAMINOPHEN 5-325 MG PO TABS: 1.0000 | ORAL_TABLET | ORAL | Status: DC | PRN

## 2014-09-30 MED ORDER — OXYCODONE-ACETAMINOPHEN 5-325 MG PO TABS: 2.0000 | ORAL_TABLET | ORAL | Status: DC | PRN

## 2014-09-30 MED ORDER — OXYCODONE-ACETAMINOPHEN 5-325 MG PO TABS
1.0000 | ORAL_TABLET | ORAL | Status: DC | PRN
  Administered 2014-09-30: 1 via ORAL
  Filled 2014-09-30: qty 1

## 2014-09-30 MED ORDER — PHENYLEPHRINE 40 MCG/ML (10ML) SYRINGE FOR IV PUSH (FOR BLOOD PRESSURE SUPPORT): 80.0000 ug | PREFILLED_SYRINGE | INTRAVENOUS | Status: DC | PRN

## 2014-09-30 MED ORDER — OXYTOCIN 40 UNITS IN LACTATED RINGERS INFUSION - SIMPLE MED
62.5000 mL/h | INTRAVENOUS | Status: DC
  Filled 2014-09-30: qty 1000

## 2014-09-30 MED ORDER — ZOLPIDEM TARTRATE 5 MG PO TABS: 5.0000 mg | ORAL_TABLET | Freq: Every evening | ORAL | Status: DC | PRN

## 2014-09-30 MED ORDER — LIDOCAINE HCL (PF) 1 % IJ SOLN: 30.0000 mL | INTRAMUSCULAR | Status: DC | PRN

## 2014-09-30 MED ORDER — SIMETHICONE 80 MG PO CHEW: 80.0000 mg | CHEWABLE_TABLET | ORAL | Status: DC | PRN

## 2014-09-30 MED ORDER — FERROUS SULFATE 325 (65 FE) MG PO TABS
325.0000 mg | ORAL_TABLET | Freq: Two times a day (BID) | ORAL | Status: DC
  Administered 2014-09-30 – 2014-10-01 (×2): 325 mg via ORAL
  Filled 2014-09-30 (×2): qty 1

## 2014-09-30 MED ORDER — LANOLIN HYDROUS EX OINT: TOPICAL_OINTMENT | CUTANEOUS | Status: DC | PRN

## 2014-09-30 MED ORDER — PRENATAL MULTIVITAMIN CH
1.0000 | ORAL_TABLET | Freq: Every day | ORAL | Status: DC
  Administered 2014-09-30 – 2014-10-01 (×2): 1 via ORAL
  Filled 2014-09-30 (×2): qty 1

## 2014-09-30 MED ORDER — LACTATED RINGERS IV SOLN: INTRAVENOUS | Status: DC

## 2014-09-30 MED ORDER — OXYTOCIN BOLUS FROM INFUSION: 500.0000 mL | INTRAVENOUS | Status: DC

## 2014-09-30 MED ORDER — FLEET ENEMA 7-19 GM/118ML RE ENEM: 1.0000 | ENEMA | Freq: Every day | RECTAL | Status: DC | PRN

## 2014-09-30 MED ORDER — WITCH HAZEL-GLYCERIN EX PADS: 1.0000 "application " | MEDICATED_PAD | CUTANEOUS | Status: DC | PRN

## 2014-09-30 MED ORDER — OXYTOCIN 40 UNITS IN LACTATED RINGERS INFUSION - SIMPLE MED: 62.5000 mL/h | INTRAVENOUS | Status: DC

## 2014-09-30 MED ORDER — BUTORPHANOL TARTRATE 1 MG/ML IJ SOLN: 1.0000 mg | INTRAMUSCULAR | Status: DC | PRN

## 2014-09-30 MED ORDER — SENNOSIDES-DOCUSATE SODIUM 8.6-50 MG PO TABS
2.0000 | ORAL_TABLET | ORAL | Status: DC
  Administered 2014-09-30: 2 via ORAL
  Filled 2014-09-30: qty 2

## 2014-09-30 MED ORDER — OXYTOCIN BOLUS FROM INFUSION
500.0000 mL | INTRAVENOUS | Status: DC
  Administered 2014-09-30: 500 mL via INTRAVENOUS

## 2014-09-30 NOTE — Lactation Note (Addendum)
This note was copied from the chart of Kristen Woodard. Lactation Consultation Note  Patient Name: Kristen Woodard RUEAV'WToday's Date: 09/30/2014 Reason for consult: Initial assessment of this mom and baby at 13 hours pp.  This is mom's second child and she states ( through family member as interpreter) that she did not breastfeed first child. She has been nursing her newborn but intended to both breast and formula feed.  She had expressed some drops by hand when shown hand expression by nurse, and with hand pump obtained several ml's which is in bottom of pump bottle but was not fed to baby.  Baby asleep with family member holding baby.  No cuing seen at this time.  LC encouraged frequent STS with mom, cue feedings at breast and avoid formula for a minimum of 2 weeks while baby learning to breastfeed and help stimulate mom's milk production.  LEAD cautions reviewed with parents.  Small newborn stomach size and reason for the slow-flowing colostrum also reviewed.Mom encouraged to feed baby 8-12 times/24 hours and with feeding cues. LC encouraged review of Baby and Me pp 9, 14 and 20-25 for STS and BF information in Spanish.   LC provided [employer]LC Resource brochure (Spanish) and reviewed WH services and list of community and web site resources, including [employer] website in Spanish.    Maternal Data Formula Feeding for Exclusion: Yes Reason for exclusion: Mother's choice to formula and breast feed on admission Has patient been taught Hand Expression?: Yes (mom informs LC that she was shown hand expression by RN, Harriett SineNancy and was given hand pump) Does the patient have breastfeeding experience prior to this delivery?: No  Feeding Feeding Type: Formula Length of feed: 2 min  LATCH Score/Interventions            LATCH scores=8/10 at several feedings since birth          Lactation Tools Discussed/Used Pump Review: Milk Storage Initiated by:: RN Date initiated:: 09/30/14 STS, cue  feedings, hand expression Supply and demand and LEAD cautions regarding early formula supplementation  Consult Status Consult Status: Follow-up Date: 10/01/14 Follow-up type: In-patient    Warrick ParisianBryant, Natthew Marlatt [facility] 09/30/2014, 8:25 PM

## 2014-09-30 NOTE — Progress Notes (Signed)
Delivery of live viable female (Kristen Woodard) by Dr. Gaynell FaceMarshall.

## 2014-09-30 NOTE — H&P (Signed)
This is Dr. Francoise CeoBernard Woodard dictating the history and physical on blank blank she is a 19 year old gravida 2 para 101 at 39 weeks and 5 days EDC 10/02/2014 negative GBS admitted in labor cervix 5 cm 90% -1 membranes intact contracting every 3-5 minutes Past medical history negative Past surgical history negative Social history negative System review negative Physical exam well-developed female in labor HEENT negative Lungs clear to P&A Heart regular rhythm no murmurs no gallops Abdomen term Pelvic as described above Extremities negative

## 2014-09-30 NOTE — Progress Notes (Signed)
I stopped by patient's room to check on her needs, I ordered  dinner, snack and breakfast, by Orlan LeavensViria Alvarez Spanish Interpreter

## 2014-09-30 NOTE — Progress Notes (Signed)
I assisted Harriett SineNancy, RN with admission questions.  Eda H Royal  Interpreter.

## 2014-09-30 NOTE — H&P (Signed)
Please see note from 3 6 16

## 2014-09-30 NOTE — Progress Notes (Signed)
UR chart review completed.  

## 2014-10-01 LAB — CBC
HCT: 34.4 % — ABNORMAL LOW (ref 36.0–46.0)
HEMOGLOBIN: 11.8 g/dL — AB (ref 12.0–15.0)
MCH: 32.2 pg (ref 26.0–34.0)
MCHC: 34.3 g/dL (ref 30.0–36.0)
MCV: 93.7 fL (ref 78.0–100.0)
Platelets: 178 10*3/uL (ref 150–400)
RBC: 3.67 MIL/uL — ABNORMAL LOW (ref 3.87–5.11)
RDW: 13.6 % (ref 11.5–15.5)
WBC: 10.6 10*3/uL — ABNORMAL HIGH (ref 4.0–10.5)

## 2014-10-01 NOTE — Progress Notes (Signed)
Patient ID: Kristen GullingJuana Woodard, female   DOB: 10-02-1995, 19 y.o.   MRN: [id number] Postpartum day one vital signs normal Fundus firm Lochia moderate legs negative wants early discharge

## 2014-10-01 NOTE — Discharge Summary (Signed)
Obstetric Discharge Summary Reason for Admission: onset of labor Prenatal Procedures: none Intrapartum Procedures: spontaneous vaginal delivery Postpartum Procedures: none Complications-Operative and Postpartum: none HEMOGLOBIN  Date Value Ref Range Status  10/01/2014 11.8* 12.0 - 15.0 g/dL Final   HCT  Date Value Ref Range Status  10/01/2014 34.4* 36.0 - 46.0 % Final    Physical Exam:  General: alert Lochia: appropriate Uterine Fundus: firm Incision: healing well DVT Evaluation: No evidence of DVT seen on physical exam.  Discharge Diagnoses: Term Pregnancy-delivered  Discharge Information: Date: 10/01/2014 Activity: pelvic rest Diet: routine Medications: Percocet Condition: stable Instructions: refer to practice specific booklet Discharge to: home Follow-up Information    Follow up with Kathreen CosierMARSHALL,BERNARD A, MD.   Specialty:  Obstetrics and Gynecology   Contact information:   [address] [place] [address] [phone]       Newborn Data: Live born female  Birth Weight: 6 lb 10.2 oz (3010 g) APGAR: 9, 9  Home with mother.  MARSHALL,BERNARD A 10/01/2014, 3:27 PM

## 2014-10-01 NOTE — Progress Notes (Signed)
Patient ID: Kristen GullingJuana Woodard, female   DOB: [birth year], 19 y.o.   MRN: [id number] Postpartum day one Vital signs normal Fundus firm Lochia moderate Legs negative doing well

## 2014-10-01 NOTE — Progress Notes (Signed)
I checked on patient's needs and ordered her lunch. Eda H Royal  Interpreter. °
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
## 2015-01-01 ENCOUNTER — Encounter (HOSPITAL_COMMUNITY): Payer: Self-pay | Admitting: Emergency Medicine

## 2015-01-01 ENCOUNTER — Emergency Department (HOSPITAL_COMMUNITY)
Admission: EM | Admit: 2015-01-01 | Discharge: 2015-01-01 | Disposition: A | Discharge status: Home / Self Care | Payer: Medicaid Other | Attending: Emergency Medicine | Admitting: Emergency Medicine

## 2015-01-01 DIAGNOSIS — Z8719 Personal history of other diseases of the digestive system: Secondary | ICD-10-CM | POA: Insufficient documentation

## 2015-01-01 DIAGNOSIS — S80211A Abrasion, right knee, initial encounter: Secondary | ICD-10-CM | POA: Insufficient documentation

## 2015-01-01 DIAGNOSIS — S199XXA Unspecified injury of neck, initial encounter: Secondary | ICD-10-CM | POA: Insufficient documentation

## 2015-01-01 DIAGNOSIS — Y9389 Activity, other specified: Secondary | ICD-10-CM | POA: Insufficient documentation

## 2015-01-01 DIAGNOSIS — S50311A Abrasion of right elbow, initial encounter: Secondary | ICD-10-CM | POA: Insufficient documentation

## 2015-01-01 DIAGNOSIS — Y9289 Other specified places as the place of occurrence of the external cause: Secondary | ICD-10-CM | POA: Insufficient documentation

## 2015-01-01 DIAGNOSIS — S3992XA Unspecified injury of lower back, initial encounter: Secondary | ICD-10-CM | POA: Insufficient documentation

## 2015-01-01 DIAGNOSIS — T148XXA Other injury of unspecified body region, initial encounter: Secondary | ICD-10-CM

## 2015-01-01 DIAGNOSIS — Y998 Other external cause status: Secondary | ICD-10-CM | POA: Insufficient documentation

## 2015-01-01 MED ORDER — BACITRACIN ZINC 500 UNIT/GM EX OINT: 1.0000 "application " | TOPICAL_OINTMENT | Freq: Two times a day (BID) | CUTANEOUS | Status: DC

## 2015-01-01 MED ORDER — IBUPROFEN 200 MG PO TABS: 600.0000 mg | ORAL_TABLET | Freq: Once | ORAL | Status: DC

## 2015-01-01 NOTE — ED Notes (Signed)
Pt not in room to receive meds or wound care. Pt not in room to receive d/c instructions.
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
## 2015-01-01 NOTE — ED Notes (Signed)
Pt c/o neck and low back pain. C/o a little nausea.
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
## 2015-04-07 ENCOUNTER — Inpatient Hospital Stay (HOSPITAL_COMMUNITY): Payer: Self-pay

## 2015-04-07 ENCOUNTER — Inpatient Hospital Stay (HOSPITAL_COMMUNITY)
Admission: AD | Admit: 2015-04-07 | Discharge: 2015-04-07 | Disposition: A | Discharge status: Home / Self Care | Payer: Self-pay | Source: Ambulatory Visit | Attending: Family Medicine | Admitting: Family Medicine

## 2015-04-07 ENCOUNTER — Encounter (HOSPITAL_COMMUNITY): Payer: Self-pay | Admitting: *Deleted

## 2015-04-07 DIAGNOSIS — R1030 Lower abdominal pain, unspecified: Secondary | ICD-10-CM | POA: Insufficient documentation

## 2015-04-07 DIAGNOSIS — IMO0002 Reserved for concepts with insufficient information to code with codable children: Secondary | ICD-10-CM

## 2015-04-07 DIAGNOSIS — B9689 Other specified bacterial agents as the cause of diseases classified elsewhere: Secondary | ICD-10-CM

## 2015-04-07 DIAGNOSIS — N760 Acute vaginitis: Secondary | ICD-10-CM | POA: Insufficient documentation

## 2015-04-07 DIAGNOSIS — A499 Bacterial infection, unspecified: Secondary | ICD-10-CM

## 2015-04-07 DIAGNOSIS — Z30431 Encounter for routine checking of intrauterine contraceptive device: Secondary | ICD-10-CM | POA: Insufficient documentation

## 2015-04-07 DIAGNOSIS — R102 Pelvic and perineal pain: Secondary | ICD-10-CM | POA: Insufficient documentation

## 2015-04-07 DIAGNOSIS — N946 Dysmenorrhea, unspecified: Secondary | ICD-10-CM | POA: Insufficient documentation

## 2015-04-07 LAB — URINALYSIS, ROUTINE W REFLEX MICROSCOPIC
Bilirubin Urine: NEGATIVE
GLUCOSE, UA: NEGATIVE mg/dL
Ketones, ur: 15 mg/dL — AB
LEUKOCYTES UA: NEGATIVE
Nitrite: NEGATIVE
Protein, ur: NEGATIVE mg/dL
Urobilinogen, UA: 0.2 mg/dL (ref 0.0–1.0)
pH: 5.5 (ref 5.0–8.0)

## 2015-04-07 LAB — POCT PREGNANCY, URINE: Preg Test, Ur: NEGATIVE

## 2015-04-07 LAB — URINE MICROSCOPIC-ADD ON

## 2015-04-07 LAB — CBC WITH DIFFERENTIAL/PLATELET
Basophils Absolute: 0 10*3/uL (ref 0.0–0.1)
Basophils Relative: 0 % (ref 0–1)
Eosinophils Absolute: 0.1 10*3/uL (ref 0.0–0.7)
Eosinophils Relative: 1 % (ref 0–5)
HCT: 38.6 % (ref 36.0–46.0)
Hemoglobin: 13.6 g/dL (ref 12.0–15.0)
LYMPHS ABS: 1 10*3/uL (ref 0.7–4.0)
LYMPHS PCT: 8 % — AB (ref 12–46)
MCH: 32.8 pg (ref 26.0–34.0)
MCHC: 35.2 g/dL (ref 30.0–36.0)
MCV: 93 fL (ref 78.0–100.0)
MONO ABS: 1 10*3/uL (ref 0.1–1.0)
Monocytes Relative: 8 % (ref 3–12)
NEUTROS ABS: 10.5 10*3/uL — AB (ref 1.7–7.7)
Neutrophils Relative %: 83 % — ABNORMAL HIGH (ref 43–77)
Platelets: 189 10*3/uL (ref 150–400)
RBC: 4.15 MIL/uL (ref 3.87–5.11)
RDW: 13 % (ref 11.5–15.5)
WBC: 12.7 10*3/uL — ABNORMAL HIGH (ref 4.0–10.5)

## 2015-04-07 LAB — WET PREP, GENITAL
Trich, Wet Prep: NONE SEEN
Yeast Wet Prep HPF POC: NONE SEEN

## 2015-04-07 LAB — HIV ANTIBODY (ROUTINE TESTING W REFLEX): HIV Screen 4th Generation wRfx: NONREACTIVE

## 2015-04-07 MED ORDER — METRONIDAZOLE 500 MG PO TABS: 500.0000 mg | ORAL_TABLET | Freq: Two times a day (BID) | ORAL | Status: DC

## 2015-04-07 MED ORDER — IBUPROFEN 600 MG PO TABS: 600.0000 mg | ORAL_TABLET | Freq: Four times a day (QID) | ORAL | Status: DC | PRN

## 2015-04-07 MED ORDER — KETOROLAC TROMETHAMINE 60 MG/2ML IM SOLN
60.0000 mg | Freq: Once | INTRAMUSCULAR | Status: AC
  Administered 2015-04-07: 60 mg via INTRAMUSCULAR
  Filled 2015-04-07: qty 2

## 2015-04-07 NOTE — MAU Provider Note (Signed)
History     CSN: [id number]  Arrival date and time: 04/07/15 0865   First Provider Initiated Contact with Patient 04/07/15 [phone]      Chief Complaint  Patient presents with  . Pelvic Pain   HPI   Hospital spanish interpretor used for interview:  Kristen Woodard is a 19 y.o. female [phone] presenting with pelvic pain. 3 days ago she started having stabbing, lower abdominal pain; this pain worsened with intercourse.  She thinks she has an infection and wants her IUD checked.   No new sexual partners + pain with intercourse 3 months ago her IUD was put in without difficulty.  Her husband checked her temperature last night under her arm and it read 103; today she has been afebrile.  She has not taken anything for pain  She rates her pain 9/10; stabbing, pulsating in the lower abdomen (patient is currently on her cell phone, laying comfortable) LMP: 4 months ago.   OB History    Gravida Para Term Preterm AB TAB SAB Ectopic Multiple Living   2 2 2       0 2      Past Medical History  Diagnosis Date  . Medical history non-contributory   . Gastritis     Past Surgical History  Procedure Laterality Date  . No past surgeries      No family history on file.  Social History  Substance Use Topics  . Smoking status: Never Smoker   . Smokeless tobacco: Never Used  . Alcohol Use: No    Allergies: No Known Allergies  No prescriptions prior to admission   Results for orders placed or performed during the hospital encounter of 04/07/15 (from the past 48 hour(s))  CBC with Differential     Status: Abnormal   Collection Time: 04/07/15  9:00 AM  Result Value Ref Range   WBC 12.7 (H) 4.0 - 10.5 K/uL   RBC 4.15 3.87 - 5.11 MIL/uL   Hemoglobin 13.6 12.0 - 15.0 g/dL   HCT 41.3 24.4 - 01.0 %   MCV 93.0 78.0 - 100.0 fL   MCH 32.8 26.0 - 34.0 pg   MCHC 35.2 30.0 - 36.0 g/dL   RDW 27.2 53.6 - 64.4 %   Platelets 189 150 - 400 K/uL   Neutrophils Relative % 83 (H) 43 - 77  %   Neutro Abs 10.5 (H) 1.7 - 7.7 K/uL   Lymphocytes Relative 8 (L) 12 - 46 %   Lymphs Abs 1.0 0.7 - 4.0 K/uL   Monocytes Relative 8 3 - 12 %   Monocytes Absolute 1.0 0.1 - 1.0 K/uL   Eosinophils Relative 1 0 - 5 %   Eosinophils Absolute 0.1 0.0 - 0.7 K/uL   Basophils Relative 0 0 - 1 %   Basophils Absolute 0.0 0.0 - 0.1 K/uL  Wet prep, genital     Status: Abnormal   Collection Time: 04/07/15  9:00 AM  Result Value Ref Range   Yeast Wet Prep HPF POC NONE SEEN NONE SEEN   Trich, Wet Prep NONE SEEN NONE SEEN   Clue Cells Wet Prep HPF POC FEW (A) NONE SEEN   WBC, Wet Prep HPF POC FEW (A) NONE SEEN    Comment: FEW BACTERIA SEEN  Urinalysis, Routine w reflex microscopic (not at [facility])     Status: Abnormal   Collection Time: 04/07/15  9:09 AM  Result Value Ref Range   Color, Urine YELLOW YELLOW   APPearance CLEAR CLEAR  Specific Gravity, Urine >1.030 (H) 1.005 - 1.030   pH 5.5 5.0 - 8.0   Glucose, UA NEGATIVE NEGATIVE mg/dL   Hgb urine dipstick LARGE (A) NEGATIVE   Bilirubin Urine NEGATIVE NEGATIVE   Ketones, ur 15 (A) NEGATIVE mg/dL   Protein, ur NEGATIVE NEGATIVE mg/dL   Urobilinogen, UA 0.2 0.0 - 1.0 mg/dL   Nitrite NEGATIVE NEGATIVE   Leukocytes, UA NEGATIVE NEGATIVE  Urine microscopic-add on     Status: None   Collection Time: 04/07/15  9:09 AM  Result Value Ref Range   Squamous Epithelial / LPF RARE RARE   WBC, UA 0-2 <3 WBC/hpf   RBC / HPF 0-2 <3 RBC/hpf  Pregnancy, urine POC     Status: None   Collection Time: 04/07/15  9:10 AM  Result Value Ref Range   Preg Test, Ur NEGATIVE NEGATIVE    Comment:        THE SENSITIVITY OF THIS METHODOLOGY IS >24 mIU/mL     Review of Systems  Constitutional: Positive for fever and chills.  Gastrointestinal: Positive for abdominal pain. Negative for nausea and vomiting.  Genitourinary: Negative for dysuria, urgency and frequency.   Physical Exam   Blood pressure 102/62, pulse 102, temperature 98.2 F (36.8 C), temperature  source Oral, resp. rate 18, height  (1.6 m), weight 51.801 kg (114 lb 3.2 oz), SpO2 99 %, unknown if currently breastfeeding.  Physical Exam  Constitutional: She is oriented to person, place, and time. She appears well-developed and well-nourished. No distress.  HENT:  Head: Normocephalic.  Eyes: Pupils are equal, round, and reactive to light.  Neck: Neck supple.  GI: Soft. She exhibits no distension. There is tenderness in the suprapubic area and left lower quadrant. There is no rebound and no guarding.  Genitourinary:  Speculum exam: Vagina - Small amount of creamy discharge, no odor Cervix - No contact bleeding, IUD strings visualized  Bimanual exam: Cervix closed, NO CMT, IUD strings palpated  Uterus non tender, normal size Adnexa non tender, no masses bilaterally GC/Chlam, wet prep done Chaperone present for exam.  Musculoskeletal: Normal range of motion.  Neurological: She is alert and oriented to person, place, and time.  Skin: Skin is warm. She is not diaphoretic.  Psychiatric: Her behavior is normal.    MAU Course  Procedures  None  MDM CBC [place] to evaluate location of IUD Toradol 60 mg IM Wet prep  GC   US Transvaginal Non-ob  04/07/2015   CLINICAL DATA:  Left lower quadrant pain with intercourse an urination.  EXAM: TRANSABDOMINAL AND TRANSVAGINAL ULTRASOUND OF PELVIS  TECHNIQUE: Both transabdominal and transvaginal ultrasound examinations of the pelvis were performed. Transabdominal technique was performed for global imaging of the pelvis including uterus, ovaries, adnexal regions, and pelvic cul-de-sac. It was necessary to proceed with endovaginal exam following the transabdominal exam to visualize the uterus, endometrium and ovaries.  COMPARISON:  None  FINDINGS: Uterus  Measurements: 8.9 x 4.5 x 5.8 cm. No fibroids or other mass visualized.  Endometrium  Thickness: IUD is identified within the endometrium and in appropriate position. Small volume of fluid  identified within the endometrial cavity.  Right ovary  Measurements: 3.5 x 2.7 x 3.6 cm. Dominant follicle is noted measuring 2.8 x 2.1 x 2.9 cm.  Left ovary  Measurements: 2.7 x 1.1 x 1.9 cm. Normal appearance/no adnexal mass.  Other findings  No free fluid.  IMPRESSION: 1. No acute findings and no explanation for patient's pelvic pain. 2. IUD is in  place and in appropriate position.   Electronically Signed   By: Signa Kell M.D.   On: 04/07/2015 11:32   US Pelvis Complete  04/07/2015   CLINICAL DATA:  Left lower quadrant pain with intercourse an urination.  EXAM: TRANSABDOMINAL AND TRANSVAGINAL ULTRASOUND OF PELVIS  TECHNIQUE: Both transabdominal and transvaginal ultrasound examinations of the pelvis were performed. Transabdominal technique was performed for global imaging of the pelvis including uterus, ovaries, adnexal regions, and pelvic cul-de-sac. It was necessary to proceed with endovaginal exam following the transabdominal exam to visualize the uterus, endometrium and ovaries.  COMPARISON:  None  FINDINGS: Uterus  Measurements: 8.9 x 4.5 x 5.8 cm. No fibroids or other mass visualized.  Endometrium  Thickness: IUD is identified within the endometrium and in appropriate position. Small volume of fluid identified within the endometrial cavity.  Right ovary  Measurements: 3.5 x 2.7 x 3.6 cm. Dominant follicle is noted measuring 2.8 x 2.1 x 2.9 cm.  Left ovary  Measurements: 2.7 x 1.1 x 1.9 cm. Normal appearance/no adnexal mass.  Other findings  No free fluid.  IMPRESSION: 1. No acute findings and no explanation for patient's pelvic pain. 2. IUD is in place and in appropriate position.   Electronically Signed   By: Signa Kell M.D.   On: 04/07/2015 11:32     Patient denies N/V> pain was resolved with Toradol> no fever.    Assessment and Plan   A:  1. BV (bacterial vaginosis)   2. Pain, pelvic, female   3. IUD check up   4. Painful sex    P:  Discharge home in stable condition Return  to MAU if symptoms worsen or fever returns RX: Flagyl, ibuprofen  Follow up with the HD as needed, as scheduled      Duane Lope, NP 04/07/2015 9:08 AM

## 2015-04-07 NOTE — Discharge Instructions (Signed)
Dolor abdominal en las mujeres °(Abdominal Pain, Women) °El dolor abdominal (en el estómago, la pelvis o el vientre) puede tener muchas causas. Es importante que le informe a su médico: °· La ubicación del dolor. °· ¿Viene y se va, o persiste todo el tiempo? °· ¿Hay situaciones que inician el dolor (comer ciertos alimentos, la actividad física)? °· ¿Tiene otros síntomas asociados al dolor (fiebre, náuseas, vómitos, diarrea)? °Todo es de gran ayuda cuando se trata de hallar la causa del dolor. °CAUSAS °· Estómago: Infecciones por virus o bacterias, o úlcera. °· Intestino: Apendicitis (apéndice inflamado), ileitis regional (enfermedad de Crohn), colitis ulcerosa (colon inflamado), síndrome del colon irritable, diverticulitis (inflamación de los divertículos del colon) o cáncer de estómago oo intestino. °· Enfermedades de la vesícula biliar o cálculos. °· Enfermedades renales, cálculos o infecciones en el riñón. °· Infección o cáncer del páncreas. °· Fibromialgia (trastorno doloroso) °· Enfermedades de los órganos femeninos: °¨ Uterus: Útero: fibroma (tumor no canceroso) o infección °¨ Trompas de Falopio: infección o embarazo ectópico °¨ En los ovarios, quistes o tumores. °¨ Adherencias pélvicas (tejido cicatrizal). °¨ Endometriosis (el tejido que cubre el útero se desarrolla en la pelvis y los órganos pélvicos). °¨ Síndrome de congestión pélvica (los órganos femeninos se llenan de sangre antes del periodo menstrual( °¨ Dolor durante el periodo menstrual. °¨ Dolor durante la ovulación (al producir óvulos). °¨ Dolor al usar el DIU (dispositivo intrauterino para el control de la natalidad) °¨ Cáncer en los órganos femeninos. °· Dolor funcional (no está originado en una enfermedad, puede mejorar sin tratamiento). °· Dolor de origen psicológico °· Depresión. °DIAGNÓSTICO °Su médico decidirá la gravedad del dolor a través del examen físico °· Análisis de sangre °· Radiografías °· Ecografías °· TC (tomografía computada, tipo  especial de radiografías). °· IMR (resonancia magnética) °· Cultivos, en el caso una infección °· Colon por enema de bario (se inserta una sustancia de contraste en el intestino grueso para mejorar la observación con rayos X.) °· Colonoscopía (observación del intestino con un tubo luminoso). °· Laparoscopía (examen del interior del abdomen con un tubo que tiene una luz). °· Cirugía exploratoria abdominal mayor (se observa el abdomen realizando una gran incisión). °TRATAMIENTO °El tratamiento dependerá de la causa del problema.  °· Muchos de estos casos pueden controlarse y tratarse en casa. °· Medicamentos de venta libre indicados por el médico. °· Medicamentos con receta. °· Antibióticos, en caso de infección °· Píldoras anticonceptivas, en el caso de períodos dolorosos o dolor al ovular. °· Tratamiento hormonal, para la endometriosis °· Inyecciones para bloqueo nervioso selectivo. °· Fisioterapia. °· Antidepresivos. °· Consejos por parte de un psícólogo o psiquiatra. °· Cirugía mayor o menor. °INSTRUCCIONES PARA EL CUIDADO DOMICILIARIO °· No tome ni administre laxantes a menos que se lo haya indicado su médico. °· Tome analgésicos de venta libre sólo si se lo ha indicado el profesional que lo asiste. No tome aspirina, ya que puede causar molestias en el estómago o hemorragias. °· Consuma una dieta líquida (caldo o agua) según lo indicado por el médico. Progrese lentamente a una dieta blanda, según la tolerancia, si el dolor se relaciona con el estómago o el intestino. °· Tenga un termómetro y tómese la temperatura varias veces al día. °· Haga reposo en la cama y duerma, si esto alivia el dolor. °· Evite las relaciones sexuales, si le producen dolor. °· Evite las situaciones estresantes. °· Cumpla con las visitas y los análisis de control, según las indicaciones de su médico. °· Si el dolor   no se alivia con los medicamentos o la cirugía, puede tratar con: °¨ Acupuntura. °¨ Ejercicios de relajación (yoga,  meditación). °¨ Terapia grupal. °¨ Psicoterapia. °SOLICITE ATENCIÓN MÉDICA SI: °· Nota que ciertos alimentos le producen dolor de estómago. °· El tratamiento indicado para realizar en el hogar no le alivia el dolor. °· Necesita analgésicos más fuertes. °· Quiere que le retiren el DIU. °· Si se siente confundido o desfalleciente. °· Presenta náuseas o vómitos. °· Aparece una erupción cutánea. °· Sufre efectos adversos o una reacción alérgica debido a los medicamentos que toma. °SOLICITE ATENCIÓN MÉDICA DE INMEDIATO SI: °· El dolor persiste o se agrava. °· Tiene fiebre. °· Siente el dolor sólo en algunos sectores del abdomen. Si se localiza en la zona derecha, posiblemente podría tratarse de apendicitis. En un adulto, si se localiza en la región inferior izquierda del abdomen, podría tratarse de colitis o diverticulitis. °· Hay sangre en las heces (deposiciones de color rojo brillante o negro alquitranado), con o sin vómitos. °· Usted presenta sangre en la orina. °· Siente escalofríos con o sin fiebre. °· Se desmaya. °ASEGÚRESE QUE:  °· Comprende estas instrucciones. °· Controlará su enfermedad. °· Solicitará ayuda de inmediato si no mejora o si empeora. °Document Released: 11/01/2008 Document Revised: 10/08/2011 °ExitCare® Patient Information ©2015 [employer]. This information is not intended to replace advice given to you by your health care provider. Make sure you discuss any questions you have with your health care provider. ° °

## 2015-04-07 NOTE — MAU Note (Signed)
Pt reports she has had a stabbing type pain in her lower abd x 3 days. Thinks it may br from her IUD. Stated she had a fever last night 103.5 and her "bones hurt".

## 2015-04-08 LAB — GC/CHLAMYDIA PROBE AMP (~~LOC~~) NOT AT ARMC
CHLAMYDIA, DNA PROBE: NEGATIVE
NEISSERIA GONORRHEA: NEGATIVE

## 2015-10-09 IMAGING — US US OB TRANSVAGINAL
1 series · 14 of 28 positions shown · non-contrast
Comparison: 01/27/2014

CLINICAL DATA: Pelvic pain.  Early pregnancy.

EXAM:
TRANSVAGINAL OB ULTRASOUND
TECHNIQUE: Transvaginal ultrasound was performed for complete evaluation of the
gestation as well as the maternal uterus, adnexal regions, and
pelvic cul-de-sac.

[Series 1: us ob transvaginal · 14 of 41 slices shown]
[im 2/41]
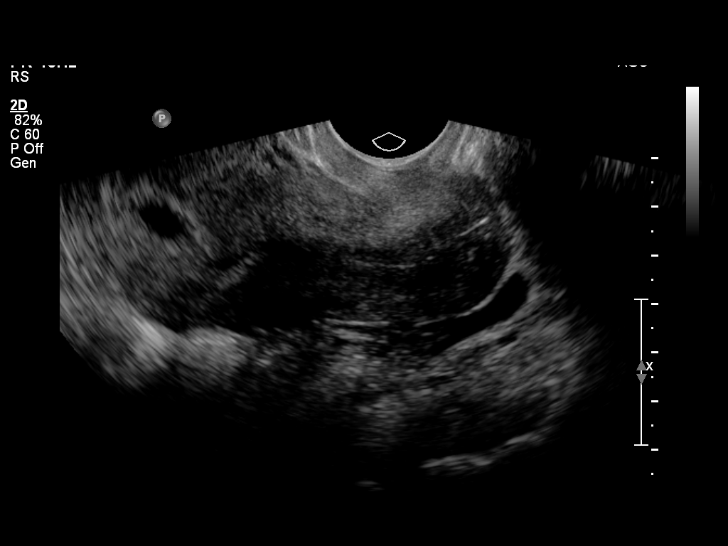
[im 5/41]
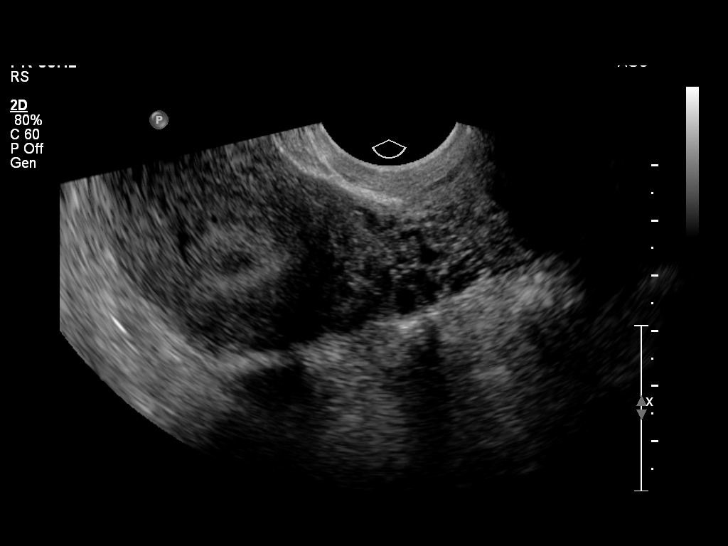
[im 8/41]
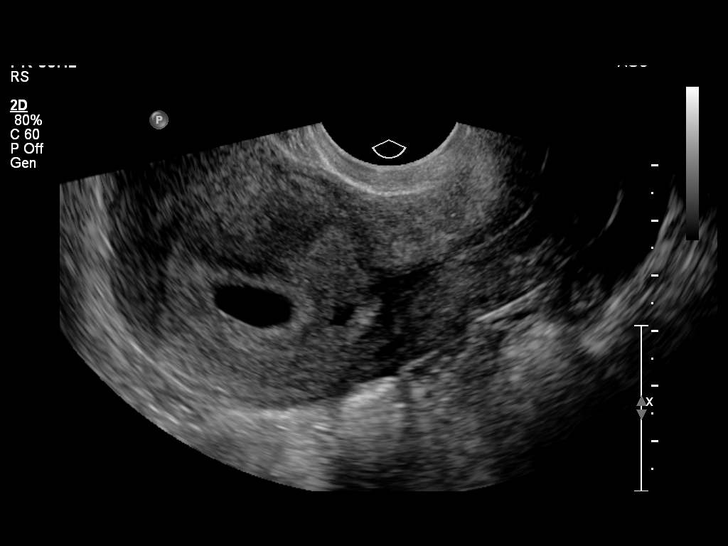
[im 11/41]
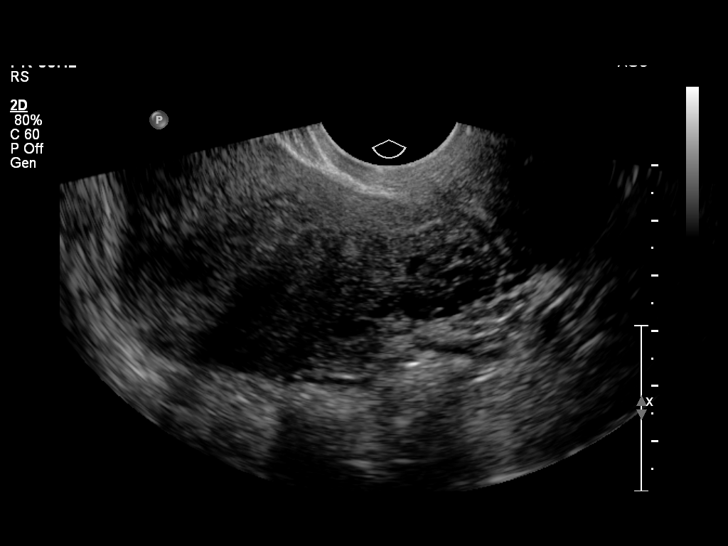
[im 14/41]
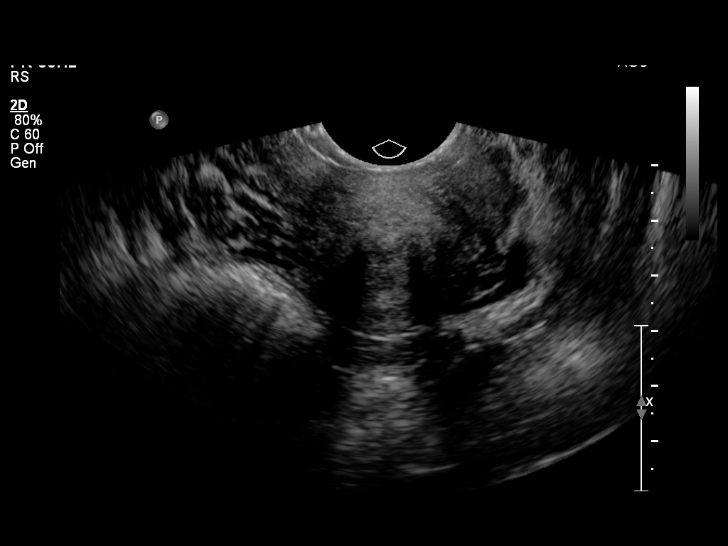
[im 17/41]
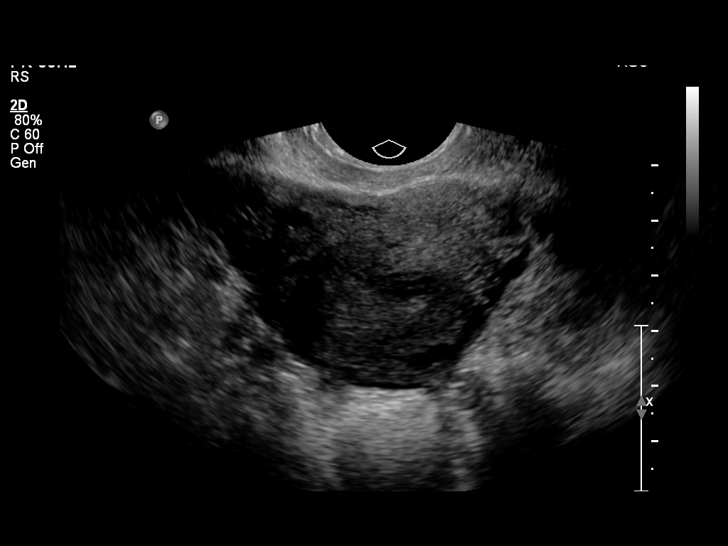
[im 20/41]
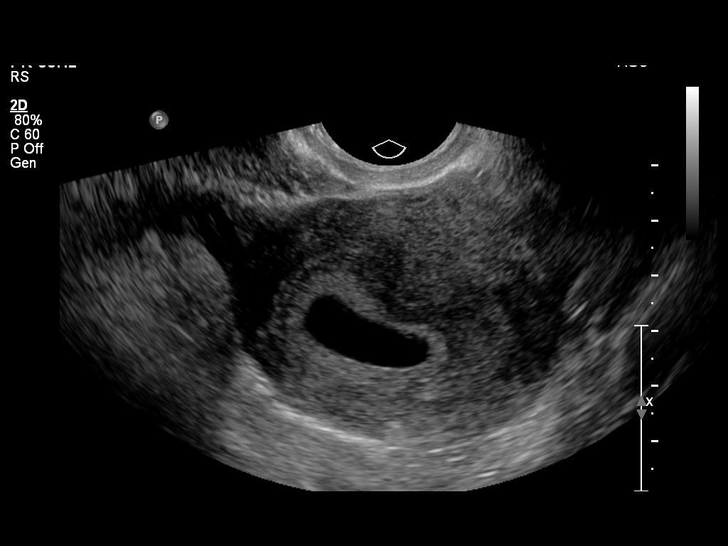
[im 23/41]
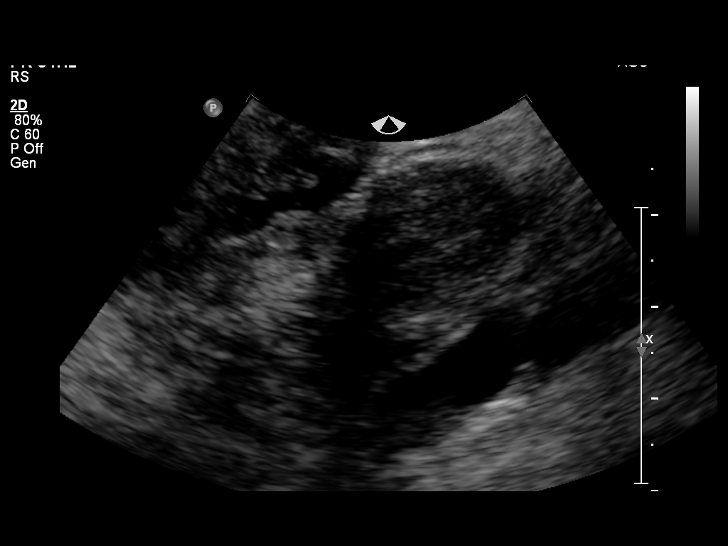
[im 26/41]
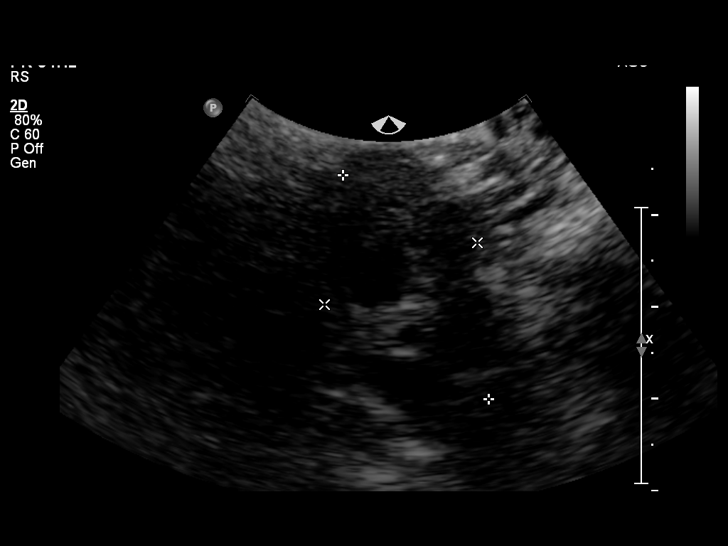
[im 29/41]
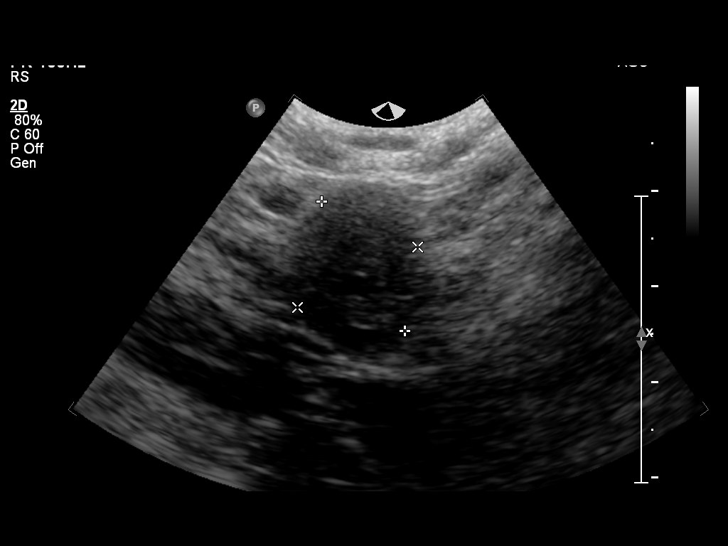
[im 32/41]
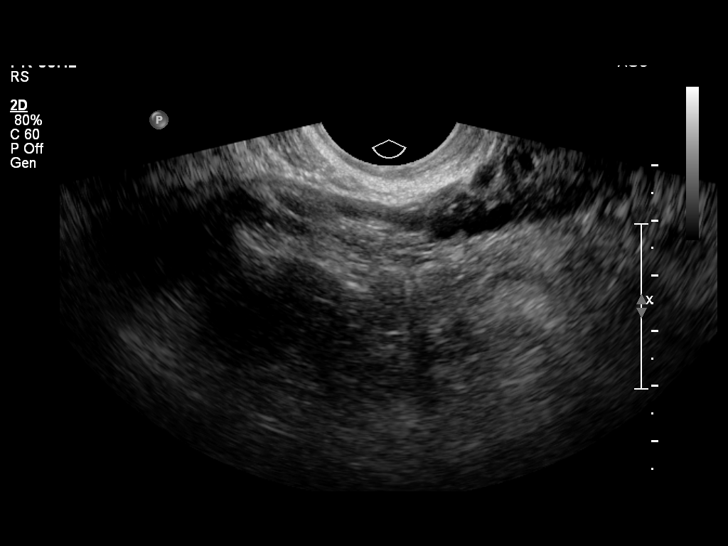
[im 35/41]
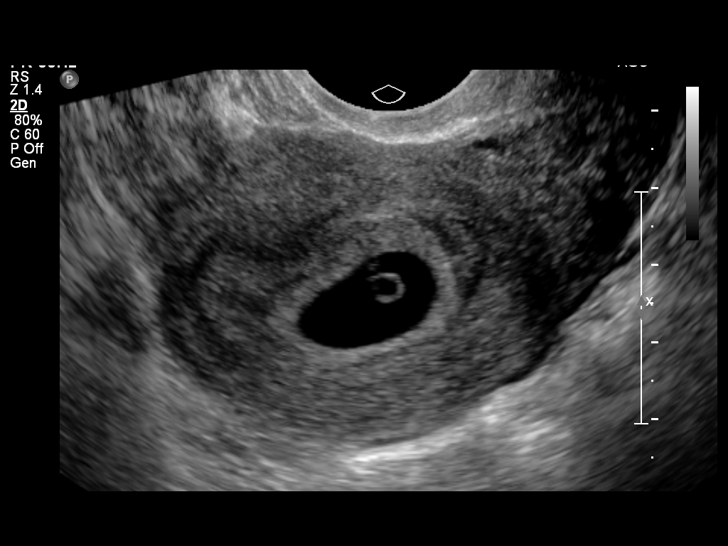
[im 38/41]
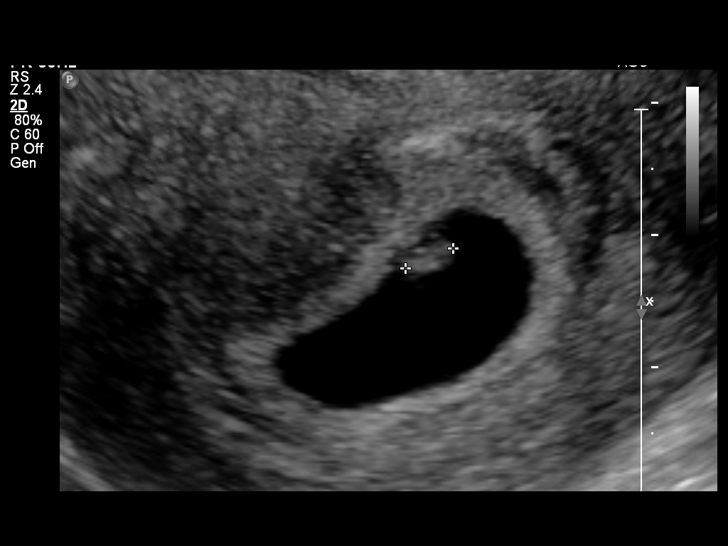
[im 41/41]
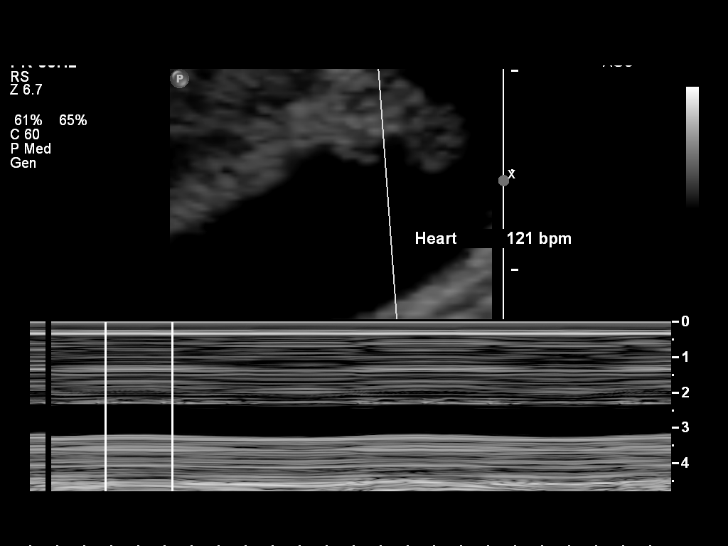

[14 of 28 positions shown; findings below may reference images not displayed]

FINDINGS: Intrauterine gestational sac: Single

Yolk sac:  Yes

Embryo:  Yes

Cardiac Activity: Yes

Heart Rate: 121 bpm

CRL:   4.2  mm   6 w 1 d                  US EDC: 10/05/2014

Maternal uterus/adnexae: Small subchorionic hemorrhage. Normal
ovaries.
IMPRESSION: Single intrauterine pregnancy of approximately 6 weeks 1 day
gestation. Small subchorionic hemorrhage.

## 2016-02-04 ENCOUNTER — Encounter (HOSPITAL_COMMUNITY): Payer: Self-pay | Admitting: Certified Nurse Midwife

## 2016-02-04 ENCOUNTER — Inpatient Hospital Stay (HOSPITAL_COMMUNITY)
Admission: AD | Admit: 2016-02-04 | Discharge: 2016-02-04 | Disposition: A | Discharge status: Home / Self Care | Payer: Self-pay | Source: Ambulatory Visit | Attending: Obstetrics and Gynecology | Admitting: Obstetrics and Gynecology

## 2016-02-04 DIAGNOSIS — R1032 Left lower quadrant pain: Secondary | ICD-10-CM | POA: Insufficient documentation

## 2016-02-04 DIAGNOSIS — Z975 Presence of (intrauterine) contraceptive device: Secondary | ICD-10-CM | POA: Insufficient documentation

## 2016-02-04 LAB — POCT PREGNANCY, URINE: Preg Test, Ur: NEGATIVE

## 2016-02-04 LAB — URINALYSIS, ROUTINE W REFLEX MICROSCOPIC
Bilirubin Urine: NEGATIVE
GLUCOSE, UA: NEGATIVE mg/dL
KETONES UR: NEGATIVE mg/dL
LEUKOCYTES UA: NEGATIVE
Nitrite: NEGATIVE
PROTEIN: NEGATIVE mg/dL
Specific Gravity, Urine: 1.015 (ref 1.005–1.030)
pH: 6 (ref 5.0–8.0)

## 2016-02-04 LAB — WET PREP, GENITAL
CLUE CELLS WET PREP: NONE SEEN
Trich, Wet Prep: NONE SEEN
YEAST WET PREP: NONE SEEN

## 2016-02-04 LAB — URINE MICROSCOPIC-ADD ON: BACTERIA UA: NONE SEEN

## 2016-02-04 MED ORDER — BUTALBITAL-APAP-CAFFEINE 50-325-40 MG PO TABS: 1.0000 | ORAL_TABLET | Freq: Four times a day (QID) | ORAL | Status: AC | PRN

## 2016-02-04 NOTE — MAU Note (Signed)
Pt states via interpreter that she has left sided abdominal pain and a lump in her vagina. Pt states intercourse is painful. Pt has a Mirena currently. Pt states she has a hx of a prolapsed uterus and pain feels the same.

## 2016-02-04 NOTE — MAU Provider Note (Signed)
History   Kristen Woodard isa 20 yo hispanic female in with LLQ pain that started 3 days ago and gotten progressively worse.Pain is dull and throbbing.  Pt has Mirina IUD in place.  CSN: [id number]  Arrival date & time 02/04/16  1430   First Provider Initiated Contact with Patient 02/04/16 1520      Chief Complaint  Patient presents with  . Vaginal Pain    HPI  Past Medical History  Diagnosis Date  . Medical history non-contributory   . Gastritis     Past Surgical History  Procedure Laterality Date  . No past surgeries      History reviewed. No pertinent family history.  Social History  Substance Use Topics  . Smoking status: Never Smoker   . Smokeless tobacco: Never Used  . Alcohol Use: No    OB History    Gravida Para Term Preterm AB TAB SAB Ectopic Multiple Living   2 2 2       0 2      Review of Systems  Constitutional: Negative.   HENT: Negative.   Eyes: Negative.   Respiratory: Negative.   Cardiovascular: Negative.   Gastrointestinal: Positive for abdominal pain.  Endocrine: Negative.   Genitourinary: Negative.   Musculoskeletal: Negative.   Skin: Negative.   Allergic/Immunologic: Negative.   Neurological: Negative.   Hematological: Negative.   Psychiatric/Behavioral: Negative.     Allergies  Review of patient's allergies indicates no known allergies.  Home Medications  No current outpatient prescriptions on file.  BP 103/63 mmHg  Pulse 74  Temp(Src) 98.1 F (36.7 C) (Oral)  Resp 18  LMP  (LMP Unknown)  Physical Exam  Constitutional: She is oriented to person, place, and time. She appears well-developed and well-nourished.  HENT:  Head: Normocephalic.  Eyes: Pupils are equal, round, and reactive to light.  Neck: Normal range of motion.  Cardiovascular: Normal rate, regular rhythm, normal heart sounds and intact distal pulses.   Pulmonary/Chest: Effort normal and breath sounds normal.  Abdominal: Soft. Bowel sounds are normal.  Genitourinary:  Vagina normal and uterus normal.  Musculoskeletal: Normal range of motion.  Neurological: She is alert and oriented to person, place, and time. She has normal reflexes.  Skin: Skin is warm and dry.  Psychiatric: She has a normal mood and affect. Her behavior is normal. Judgment and thought content normal.    MAU Course  Procedures (including critical care time)  Labs Reviewed  WET PREP, GENITAL  URINALYSIS, ROUTINE W REFLEX MICROSCOPIC (NOT AT [facility])  GC/CHLAMYDIA PROBE AMP ([facility]) NOT AT [facility]   No results found.   No diagnosis found.    MDM  LLQ pain Sterile spec exam done no abnormal discharge, IUD strings noted. Cultures and wet prep obtained and normal. bimanuel exan no tenderness noted with exam. Will d/c home.

## 2016-02-04 NOTE — Discharge Instructions (Signed)
Dolor abdominal en adultos °(Abdominal Pain, Adult) °El dolor puede tener muchas causas. Normalmente la causa del dolor abdominal no es una enfermedad y mejorará sin tratamiento. Frecuentemente puede controlarse y tratarse en casa. Su médico le realizará un examen físico y posiblemente solicite análisis de sangre y radiografías para ayudar a determinar la gravedad de su dolor. Sin embargo, en muchos casos, debe transcurrir más tiempo antes de que se pueda encontrar una causa evidente del dolor. Antes de llegar a ese punto, es posible que su médico no sepa si necesita más pruebas o un tratamiento más profundo. °INSTRUCCIONES PARA EL CUIDADO EN EL HOGAR  °Esté atento al dolor para ver si hay cambios. Las siguientes indicaciones ayudarán a aliviar cualquier molestia que pueda sentir: °· Tome solo medicamentos de venta libre o recetados, según las indicaciones del médico. °· No tome laxantes a menos que se lo haya indicado su médico. °· Pruebe con una dieta líquida absoluta (caldo, té o agua) según se lo indique su médico. Introduzca gradualmente una dieta normal, según su tolerancia. °SOLICITE ATENCIÓN MÉDICA SI: °· Tiene dolor abdominal sin explicación. °· Tiene dolor abdominal relacionado con náuseas o diarrea. °· Tiene dolor cuando orina o defeca. °· Experimenta dolor abdominal que lo despierta de noche. °· Tiene dolor abdominal que empeora o mejora cuando come alimentos. °· Tiene dolor abdominal que empeora cuando come alimentos grasosos. °· Tiene fiebre. °SOLICITE ATENCIÓN MÉDICA DE INMEDIATO SI:  °· El dolor no desaparece en un plazo máximo de 2 horas. °· No deja de (vomitar). °· El dolor se siente solo en partes del abdomen, como el lado derecho o la parte inferior izquierda del abdomen. °· Evacúa materia fecal sanguinolenta o negra, de aspecto alquitranado. °ASEGÚRESE DE QUE: °· Comprende estas instrucciones. °· Controlará su afección. °· Recibirá ayuda de inmediato si no mejora o si empeora. °  °Esta  información no tiene como fin reemplazar el consejo del médico. Asegúrese de hacerle al médico cualquier pregunta que tenga. °  °Document Released: 07/16/2005 Document Revised: 08/06/2014 °Elsevier Interactive Patient Education ©2016 [employer] ° °

## 2016-02-06 LAB — GC/CHLAMYDIA PROBE AMP (~~LOC~~) NOT AT ARMC
CHLAMYDIA, DNA PROBE: NEGATIVE
NEISSERIA GONORRHEA: NEGATIVE

## 2016-03-18 ENCOUNTER — Emergency Department (HOSPITAL_COMMUNITY): Payer: Self-pay

## 2016-03-18 ENCOUNTER — Emergency Department (HOSPITAL_COMMUNITY)
Admission: EM | Admit: 2016-03-18 | Discharge: 2016-03-18 | Disposition: A | Discharge status: Home / Self Care | Payer: Self-pay | Attending: Emergency Medicine | Admitting: Emergency Medicine

## 2016-03-18 ENCOUNTER — Encounter (HOSPITAL_COMMUNITY): Payer: Self-pay

## 2016-03-18 DIAGNOSIS — F1090 Alcohol use, unspecified, uncomplicated: Secondary | ICD-10-CM

## 2016-03-18 DIAGNOSIS — Z7289 Other problems related to lifestyle: Secondary | ICD-10-CM

## 2016-03-18 DIAGNOSIS — S81012A Laceration without foreign body, left knee, initial encounter: Secondary | ICD-10-CM | POA: Insufficient documentation

## 2016-03-18 DIAGNOSIS — Z23 Encounter for immunization: Secondary | ICD-10-CM | POA: Insufficient documentation

## 2016-03-18 DIAGNOSIS — W25XXXA Contact with sharp glass, initial encounter: Secondary | ICD-10-CM | POA: Insufficient documentation

## 2016-03-18 DIAGNOSIS — F1010 Alcohol abuse, uncomplicated: Secondary | ICD-10-CM | POA: Insufficient documentation

## 2016-03-18 DIAGNOSIS — Y929 Unspecified place or not applicable: Secondary | ICD-10-CM | POA: Insufficient documentation

## 2016-03-18 DIAGNOSIS — Y999 Unspecified external cause status: Secondary | ICD-10-CM | POA: Insufficient documentation

## 2016-03-18 DIAGNOSIS — Y939 Activity, unspecified: Secondary | ICD-10-CM | POA: Insufficient documentation

## 2016-03-18 DIAGNOSIS — S51811A Laceration without foreign body of right forearm, initial encounter: Secondary | ICD-10-CM | POA: Insufficient documentation

## 2016-03-18 MED ORDER — LIDOCAINE-EPINEPHRINE 2 %-1:100000 IJ SOLN
10.0000 mL | Freq: Once | INTRAMUSCULAR | Status: AC
  Administered 2016-03-18: 10 mL
  Filled 2016-03-18: qty 1

## 2016-03-18 MED ORDER — CEPHALEXIN 500 MG PO CAPS: 500.0000 mg | ORAL_CAPSULE | Freq: Two times a day (BID) | ORAL | 0 refills | Status: AC

## 2016-03-18 MED ORDER — TETANUS-DIPHTH-ACELL PERTUSSIS 5-2.5-18.5 LF-MCG/0.5 IM SUSP
0.5000 mL | Freq: Once | INTRAMUSCULAR | Status: AC
  Administered 2016-03-18: 0.5 mL via INTRAMUSCULAR
  Filled 2016-03-18: qty 0.5

## 2016-03-18 NOTE — ED Notes (Signed)
Dressing and cleaning to right forearm and left knee no bleeding or drainage noted.

## 2016-03-18 NOTE — ED Triage Notes (Signed)
Broke out window with right arm with lacerations bleeding controlled with possible ETOH
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
## 2016-03-25 ENCOUNTER — Encounter (HOSPITAL_COMMUNITY): Payer: Self-pay | Admitting: *Deleted

## 2016-03-25 ENCOUNTER — Emergency Department (HOSPITAL_COMMUNITY)
Admission: EM | Admit: 2016-03-25 | Discharge: 2016-03-25 | Disposition: A | Discharge status: Home / Self Care | Payer: Self-pay | Attending: Emergency Medicine | Admitting: Emergency Medicine

## 2016-03-25 DIAGNOSIS — Z4802 Encounter for removal of sutures: Secondary | ICD-10-CM | POA: Insufficient documentation
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
## 2016-03-25 NOTE — Discharge Instructions (Signed)
The wound is not ready for the sutures to be removed today. If they were to be removed today, they wound would open up. Please return in 2-3 days for reassessment. Continue to keep the wound clean and dry.

## 2016-03-25 NOTE — ED Triage Notes (Addendum)
Pt here for suture removal. Pt had sutures placed 8 days ago. Pt denies drainage fever.

## 2016-03-30 ENCOUNTER — Encounter (HOSPITAL_COMMUNITY): Payer: Self-pay | Admitting: Emergency Medicine

## 2016-03-30 ENCOUNTER — Emergency Department (HOSPITAL_COMMUNITY)
Admission: EM | Admit: 2016-03-30 | Discharge: 2016-03-30 | Disposition: A | Discharge status: Home / Self Care | Payer: Self-pay | Attending: Emergency Medicine | Admitting: Emergency Medicine

## 2016-03-30 DIAGNOSIS — Z4802 Encounter for removal of sutures: Secondary | ICD-10-CM | POA: Insufficient documentation

## 2016-03-30 NOTE — ED Triage Notes (Signed)
Per video translator, pt reports she was told to come today to have the stitches on her right arm and left leg removed. Denies pain.
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
## 2016-03-30 NOTE — Discharge Instructions (Signed)
Please return to emergency room if any signs of infection present. Please keep wound closed with supplies given here in the emergency room.

## 2016-12-03 IMAGING — US US TRANSVAGINAL NON-OB
1 series · 15 of 25 positions shown · non-contrast
Comparison: None

CLINICAL DATA: Left lower quadrant pain with intercourse an
urination.

EXAM:
TRANSABDOMINAL AND TRANSVAGINAL ULTRASOUND OF PELVIS
TECHNIQUE: Both transabdominal and transvaginal ultrasound examinations of the
pelvis were performed. Transabdominal technique was performed for
global imaging of the pelvis including uterus, ovaries, adnexal
regions, and pelvic cul-de-sac. It was necessary to proceed with
endovaginal exam following the transabdominal exam to visualize the
uterus, endometrium and ovaries..

[Series 1: us transvaginal non-ob · 15 of 73 slices shown]
[im 1/73]
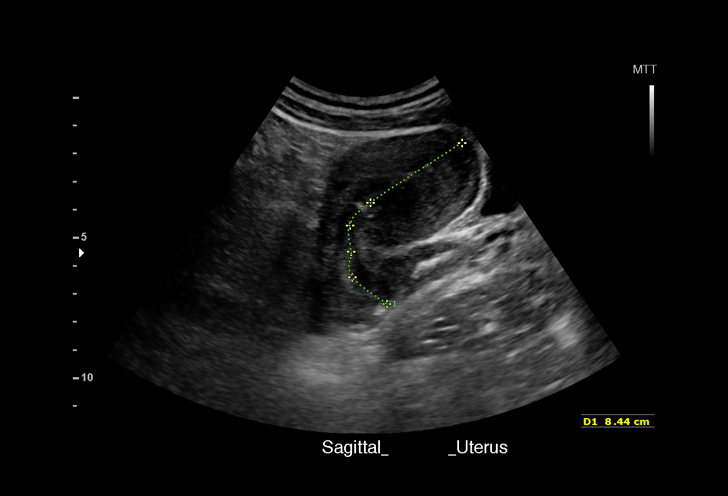
[im 7/73]
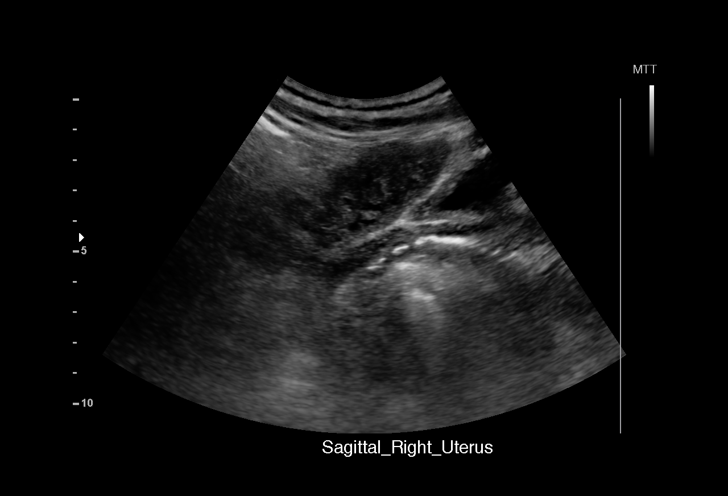
[im 13/73]
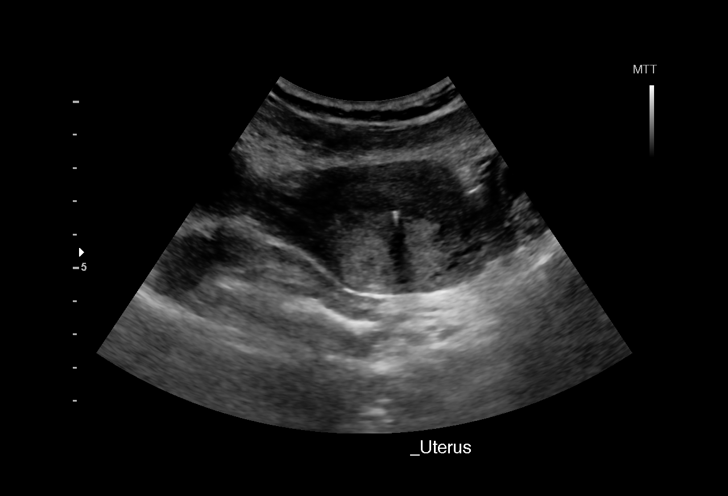
[im 16/73]
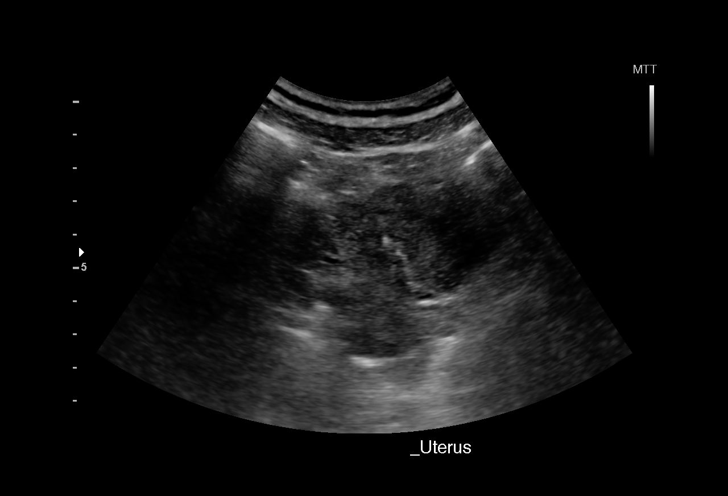
[im 22/73]
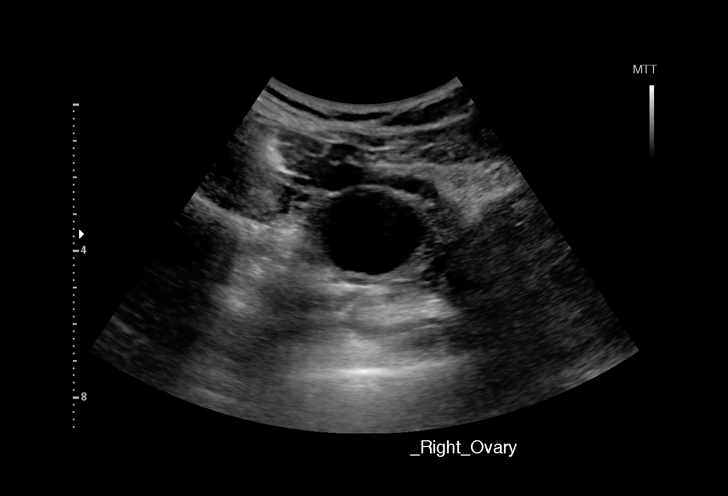
[im 28/73]
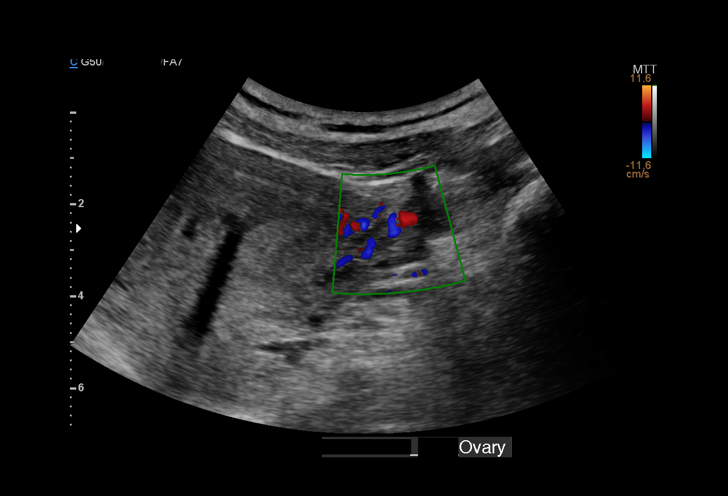
[im 31/73]
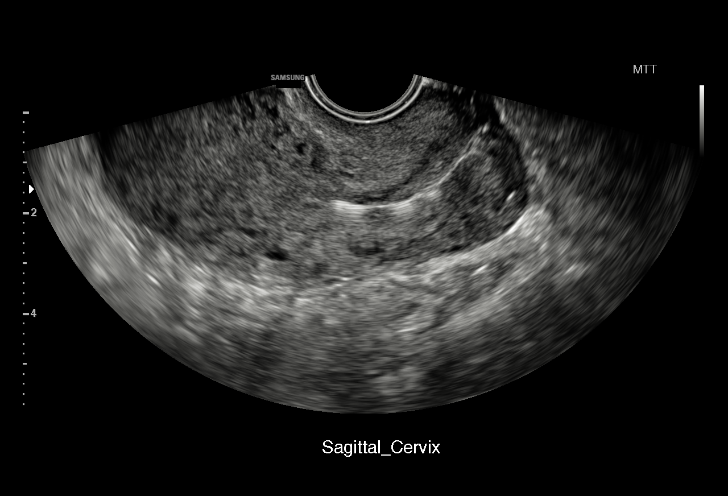
[im 37/73]
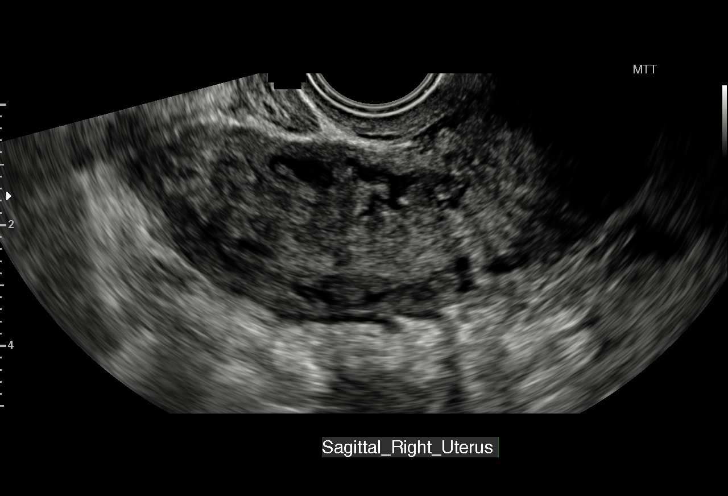
[im 43/73]
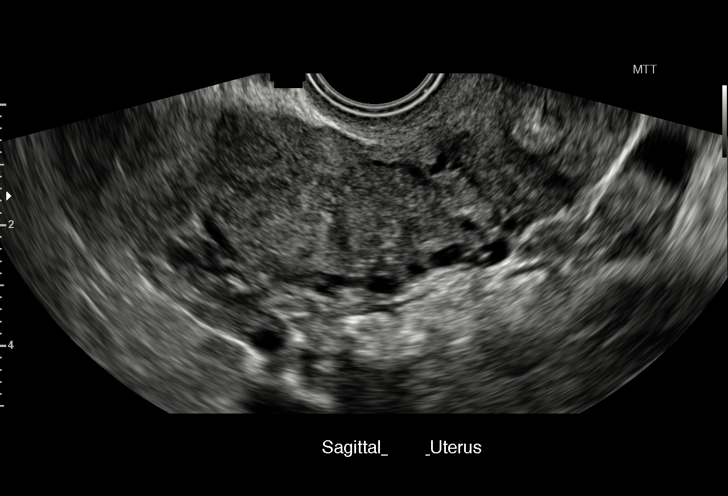
[im 46/73]
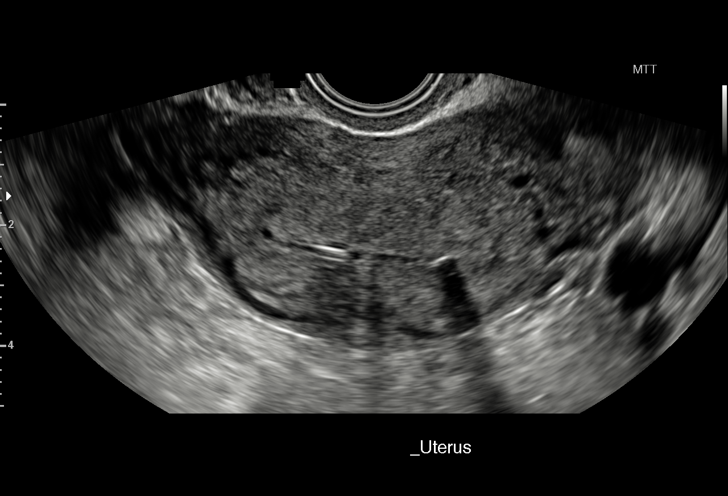
[im 52/73]
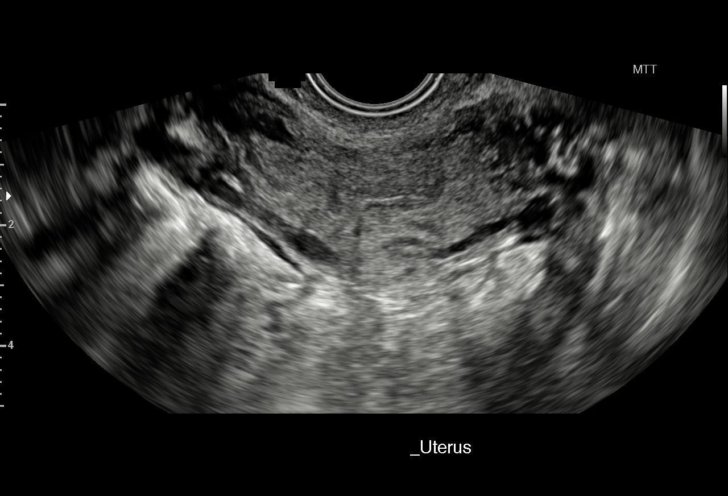
[im 58/73]
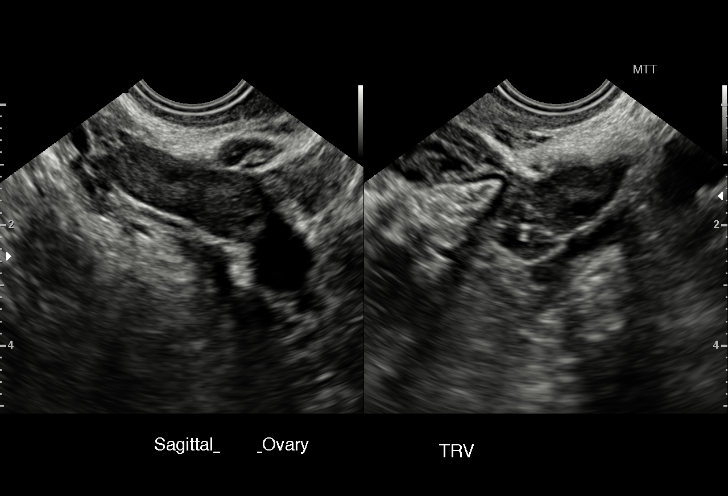
[im 61/73]
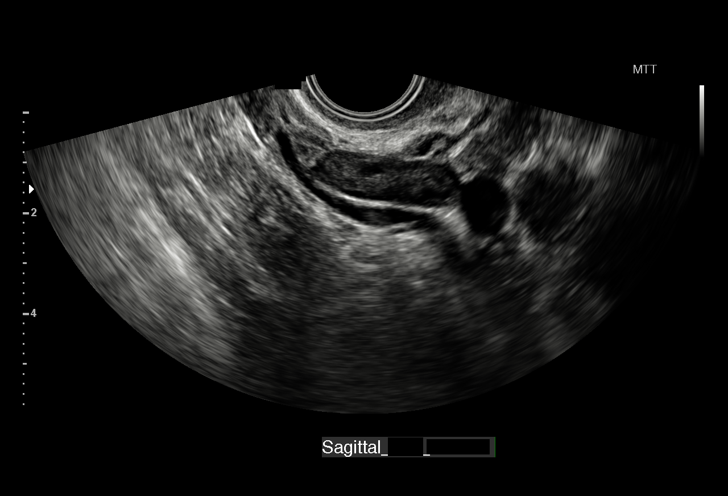
[im 67/73]
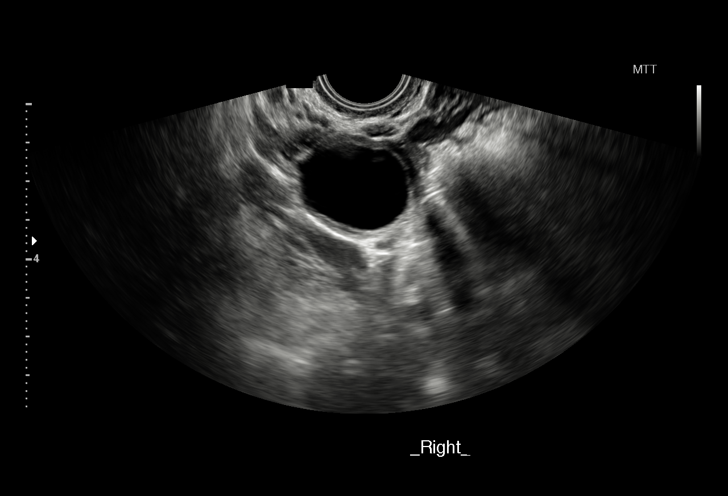
[im 73/73]
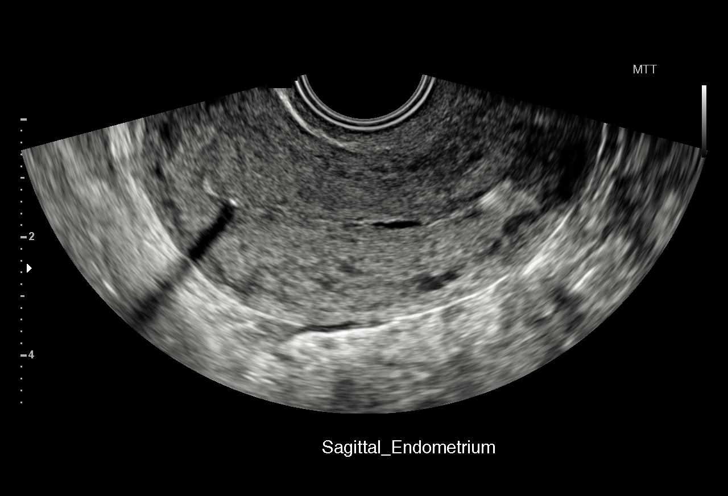

[15 of 25 positions shown; findings below may reference images not displayed]

FINDINGS: Uterus

Measurements: 8.9 x 4.5 x 5.8 cm. No fibroids or other mass
visualized.

Endometrium

Thickness: IUD is identified within the endometrium and in
appropriate position.. Small volume of fluid identified within the
endometrial cavity.

Right ovary

Measurements: 3.5 x 2.7 x 3.6 cm. Dominant follicle is noted
measuring 2.8 x 2.1 x 2.9 cm.

Left ovary

Measurements: 2.7 x 1.1 x 1.9 cm. Normal appearance/no adnexal mass.

Other findings

No free fluid.
IMPRESSION: 1. No acute findings and no explanation for patient's pelvic pain.
2. IUD is in place and in appropriate position.

## 2017-11-14 IMAGING — CR DG HAND 2V*R*
2 series · 2 of 2 positions shown · non-contrast
Comparison: None.

CLINICAL DATA: Cut hand by glass window.  Initial encounter.

EXAM:
RIGHT HAND - 2 VIEW

[x hand lat right]
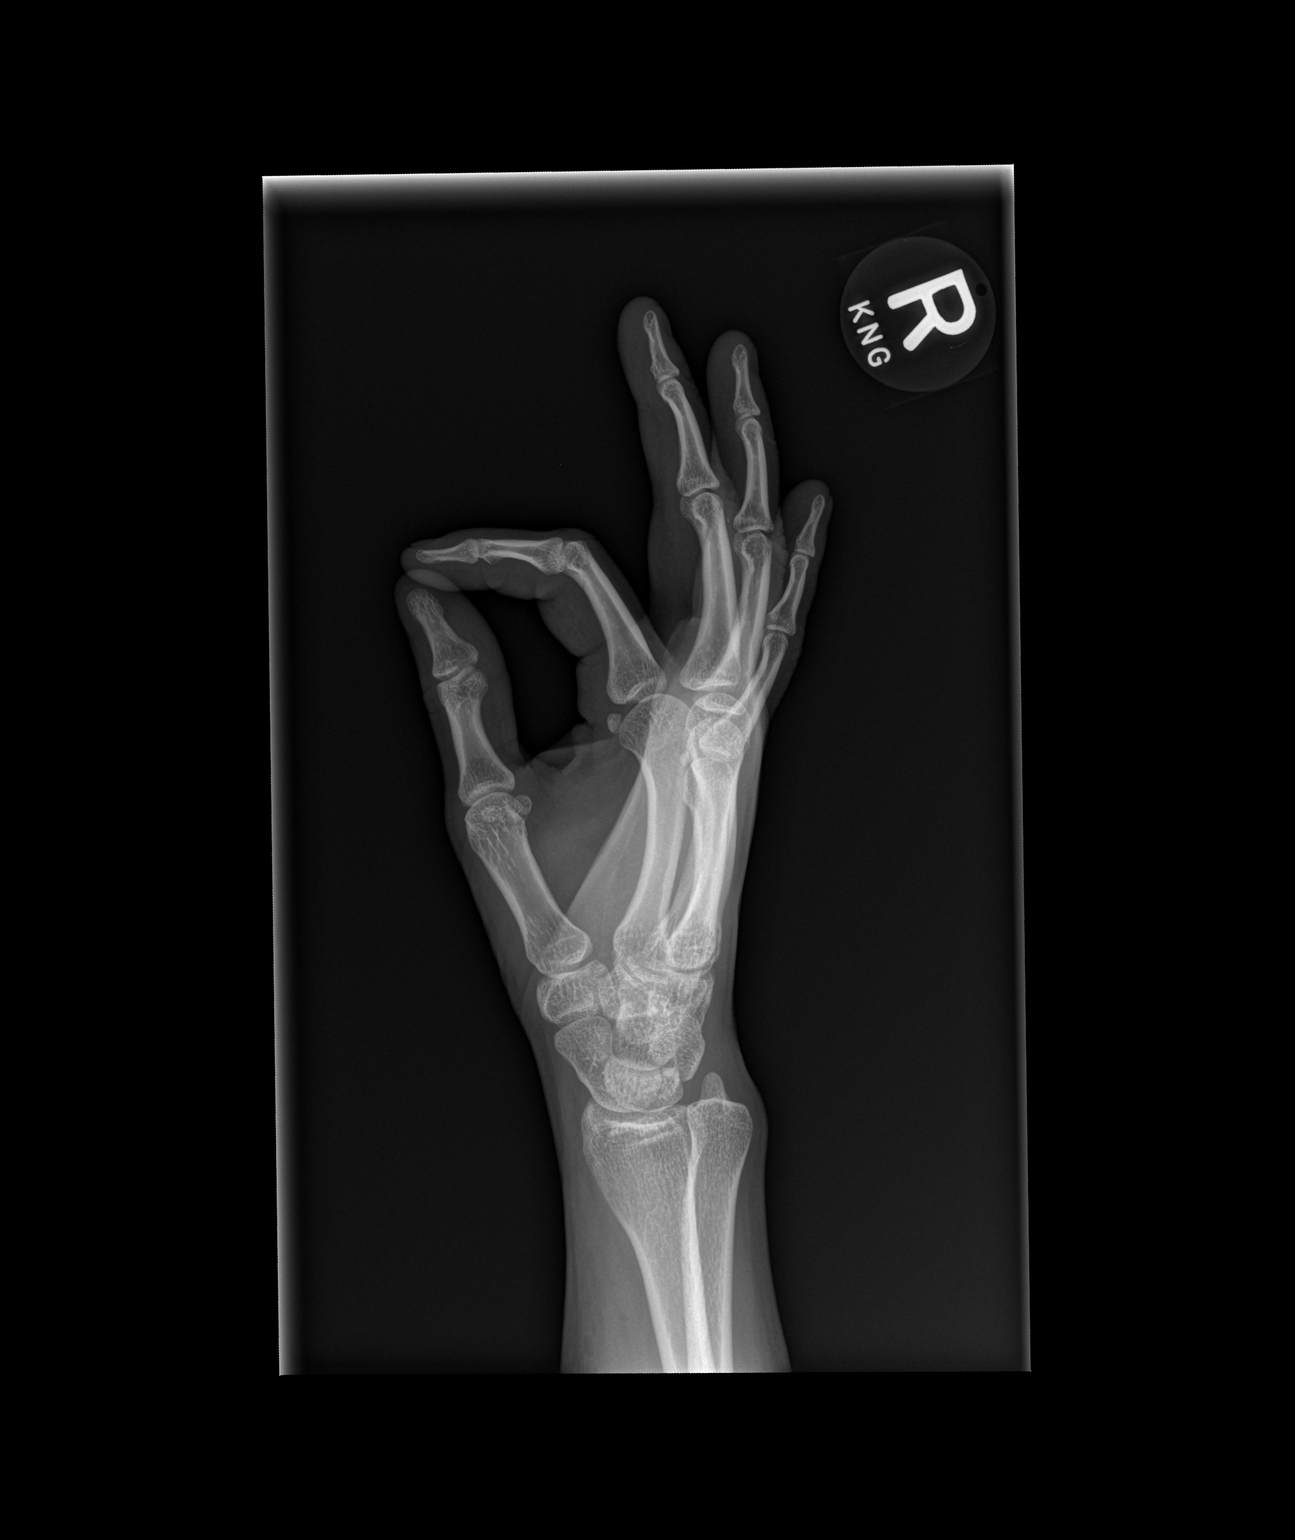

[x hand pa right]
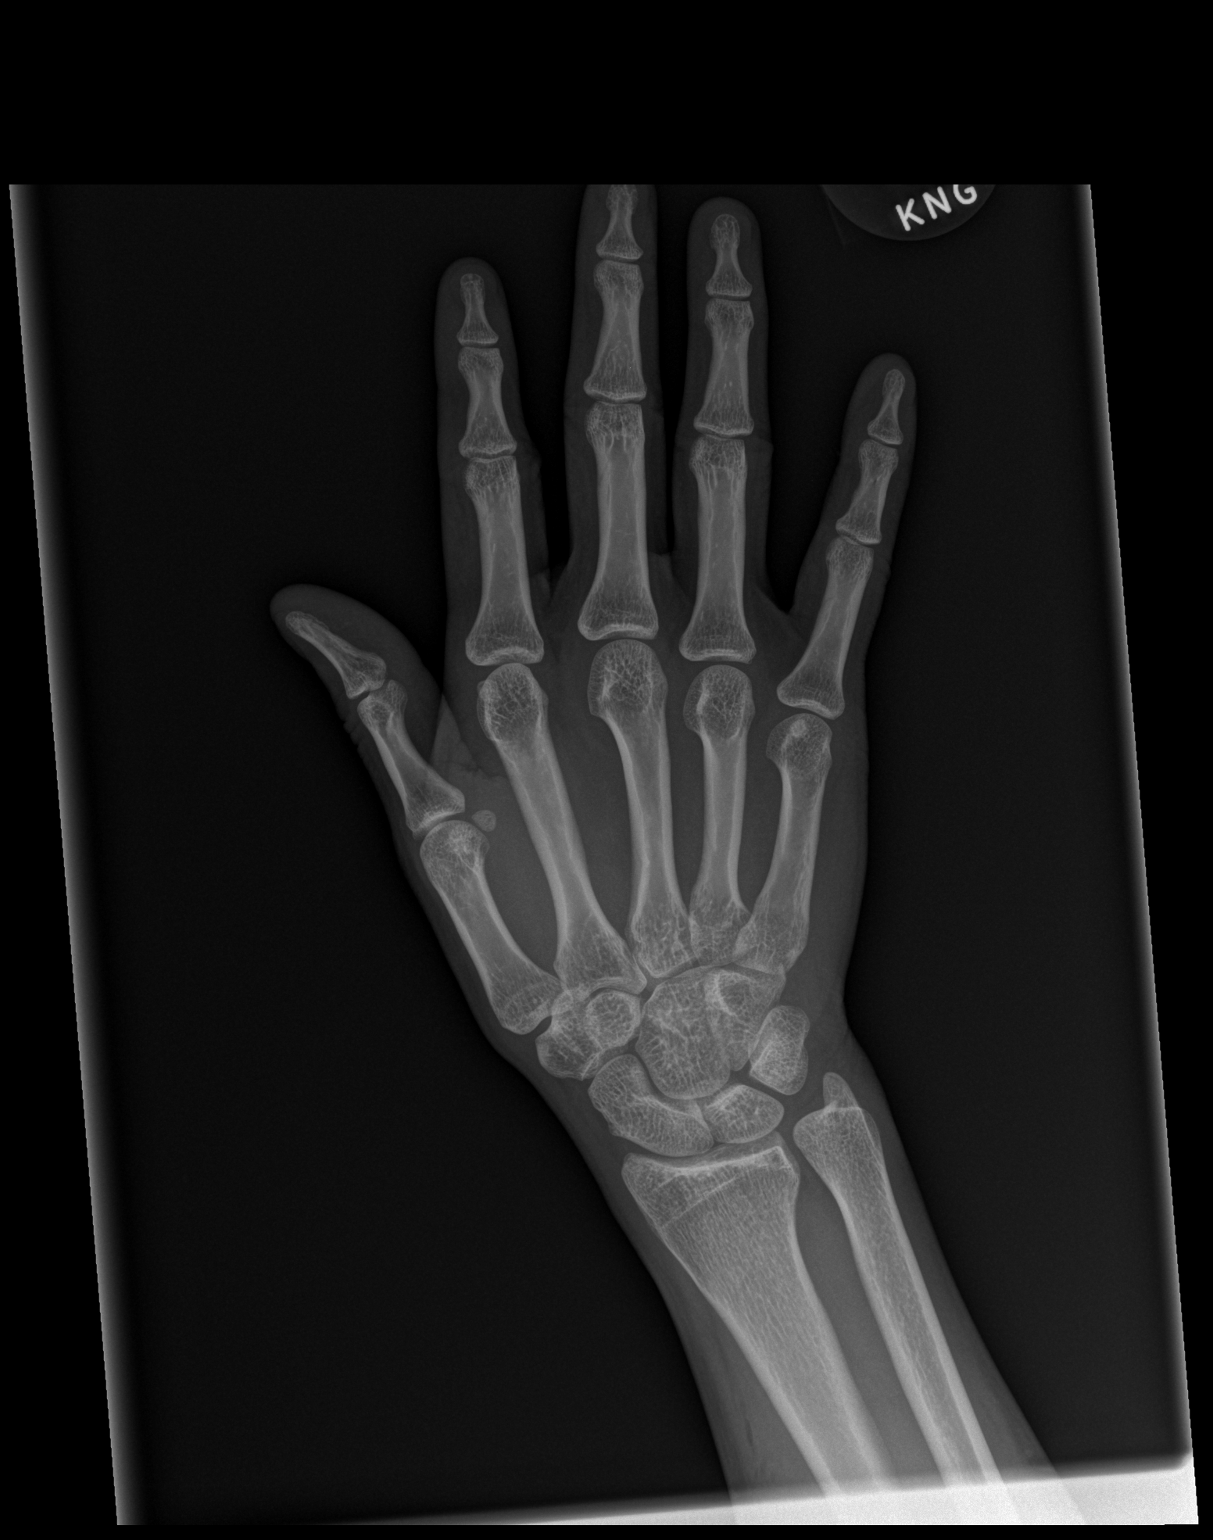

[2 of 2 positions shown; findings below may reference images not displayed]

FINDINGS: Negative for acute fracture or malalignment. Punctate radiodensity
overlapping the soft tissues of the middle and ring fingers in the
lateral projection is not seen frontally and not convincing for
foreign body. Soft tissue gas seen in the distal forearm correlating
with laceration history.
IMPRESSION: Negative for fracture or opaque foreign body.
